# Patient Record
Sex: Male | Born: 1940 | Race: White | Hispanic: No | Smoking: Former smoker
Health system: Southern US, Community
[De-identification: ages and names within clinical notes are randomized; demographics above are authoritative.]

## PROBLEM LIST (undated history)

## (undated) DIAGNOSIS — E213 Hyperparathyroidism, unspecified: Secondary | ICD-10-CM

## (undated) DIAGNOSIS — E785 Hyperlipidemia, unspecified: Secondary | ICD-10-CM

## (undated) DIAGNOSIS — M48061 Spinal stenosis, lumbar region without neurogenic claudication: Secondary | ICD-10-CM

## (undated) DIAGNOSIS — L409 Psoriasis, unspecified: Secondary | ICD-10-CM

## (undated) DIAGNOSIS — I10 Essential (primary) hypertension: Secondary | ICD-10-CM

## (undated) DIAGNOSIS — E559 Vitamin D deficiency, unspecified: Secondary | ICD-10-CM

## (undated) DIAGNOSIS — N189 Chronic kidney disease, unspecified: Secondary | ICD-10-CM

## (undated) DIAGNOSIS — D649 Anemia, unspecified: Secondary | ICD-10-CM

## (undated) DIAGNOSIS — K219 Gastro-esophageal reflux disease without esophagitis: Secondary | ICD-10-CM

## (undated) HISTORY — DX: Gastro-esophageal reflux disease without esophagitis: K21.9

## (undated) HISTORY — DX: Vitamin D deficiency, unspecified: E55.9

## (undated) HISTORY — DX: Spinal stenosis, lumbar region without neurogenic claudication: M48.061

## (undated) HISTORY — DX: Essential (primary) hypertension: I10

## (undated) HISTORY — DX: Anemia, unspecified: D64.9

## (undated) HISTORY — DX: Chronic kidney disease, unspecified: N18.9

## (undated) HISTORY — DX: Hyperparathyroidism, unspecified: E21.3

## (undated) HISTORY — DX: Hyperlipidemia, unspecified: E78.5

## (undated) HISTORY — DX: Psoriasis, unspecified: L40.9

---

## 2003-03-29 ENCOUNTER — Ambulatory Visit (HOSPITAL_COMMUNITY): Admission: RE | Admit: 2003-03-29 | Discharge: 2003-03-29 | Payer: Self-pay | Admitting: Gastroenterology

## 2008-10-11 ENCOUNTER — Encounter: Admission: RE | Admit: 2008-10-11 | Discharge: 2008-10-11 | Payer: Self-pay | Admitting: Family Medicine

## 2010-10-19 IMAGING — CR DG CHEST 2V
2 series · 2 of 2 positions shown · non-contrast
Comparison: None

CLINICAL DATA: Abnormal physical exam.  Dullness to percussion of
the left upper lobe posteriorly.

CHEST - 2 VIEW

[view not recorded (1 of 2)]
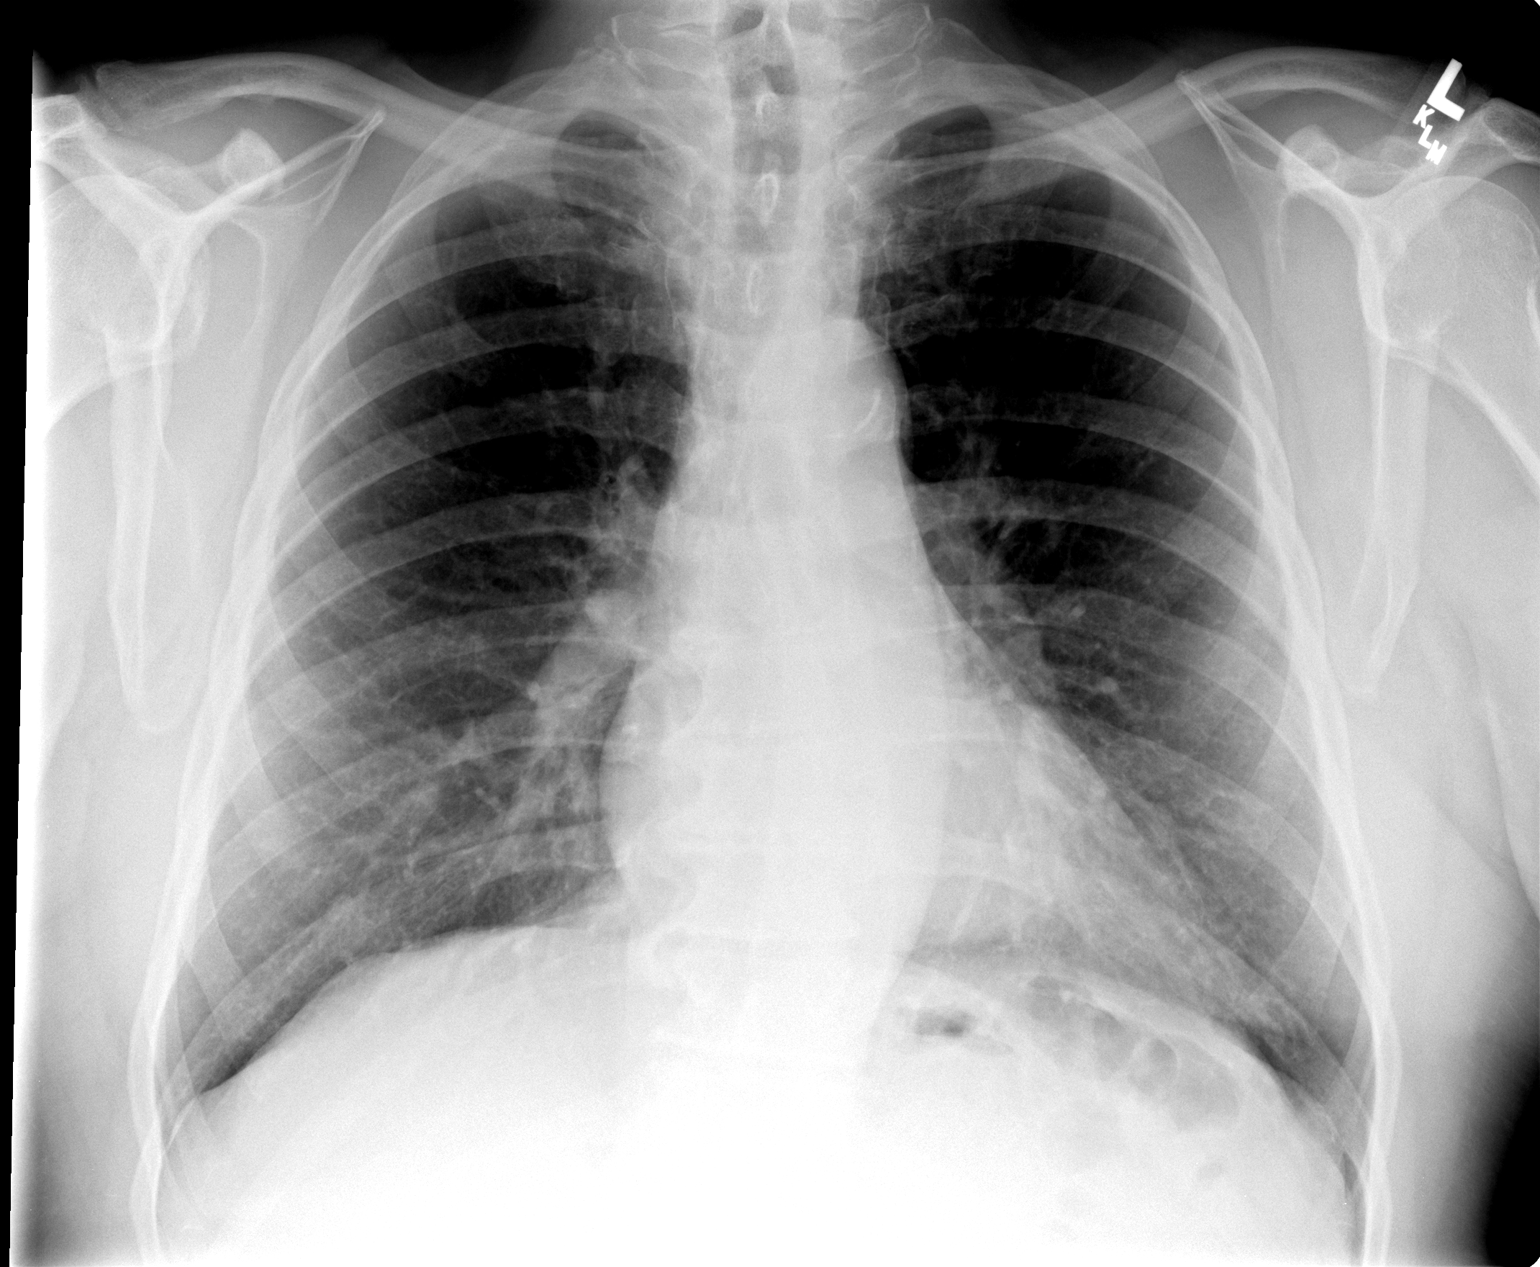

[view not recorded (2 of 2)]
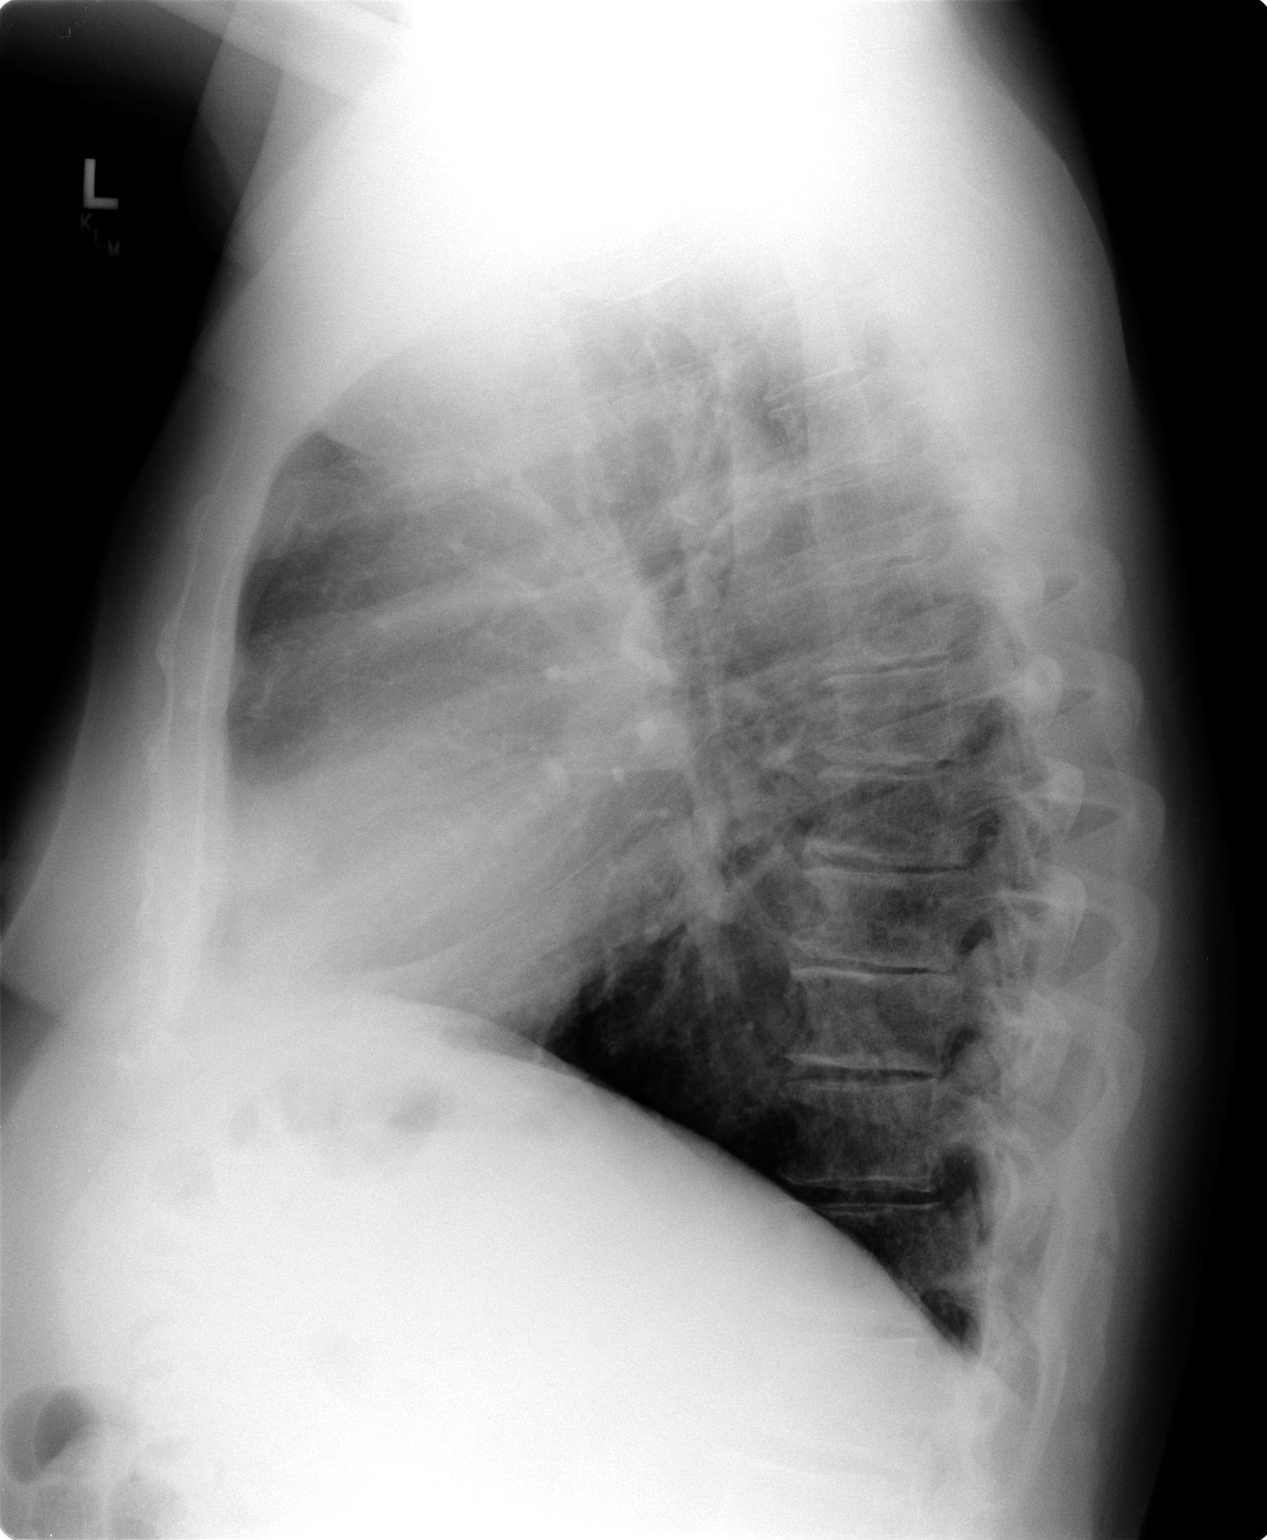

[2 of 2 positions shown; findings below may reference images not displayed]

FINDINGS: The heart size and pulmonary vascularity are normal.
There is slight tortuosity and calcification of the thoracic aorta.
The lungs are clear.  No significant bony abnormality.
Specifically, no evidence of abnormality in the left upper lobe.
IMPRESSION: No acute disease in the chest.

## 2010-11-30 NOTE — Op Note (Signed)
   NAME:  Isaac Beltran, Isaac Beltran                            ACCOUNT NO.:  [id number]   MEDICAL RECORD NO.:  [id number]                   PATIENT TYPE:  AMB   LOCATION:  ENDO                                 FACILITY:  [facility]   PHYSICIAN:  Griffith Citron, M.D.             DATE OF BIRTH:  09/02/1940   DATE OF PROCEDURE:  03/29/2003  DATE OF DISCHARGE:  03/29/2003                                 OPERATIVE REPORT   PROCEDURE PERFORMED:  Esophageal manometry.   DATE OF INTERPRETATION:  April 06, 2003.   INDICATIONS FOR PROCEDURE:  The patient is a 70 year old white male with  dysphagia.  Barium swallow with beaked appearance to the distal esophagus  suggestive of achalasia.  Also with spasm and tertiary esophageal  contraction.  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   DESCRIPTION OF PROCEDURE:  After reviewing the nature of the procedure with  the patient including but not limited to potential risks of hemorrhage and  perforation, informed consent was signed.  The patient underwent nasal  esophageal intubation using a low compliance catheter placed through the  left naris.  The study was successfully completed without immediate  complication.   RESULTS:  The lower esophageal sphincter was measured at 43.5 to 48.0 cm by  station pull through.  Total length of the lower esophageal sphincter was  4.5 cm.  Mean LESP 46.5 mmHg, just above normal (10.0-45.0 mmHg) with wet  swallows there was relaxation with 90% of these.  Close inspection of the  profile revealed very rapid transient relaxation with rapid return of  baseline tonicity appearing to be asynchronous with esophageal body  contractions.   The esophageal body revealed a mean amplitude of 75 mmHg with mean duration  of 3.7 seconds, both well within normal limits.  Peristalsis was seen on all  tracings.   The upper esophageal body functioned normal with good coordination with  esophageal body  propagation of contractions.   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   RECOMMENDATIONS:  Proceed with Botox therapy given the patient's persistent  symptoms despite symptoms esophageal dilation.                                               Griffith Citron, M.D.   JRM/MEDQ  D:  04/06/2003  T:  04/06/2003  Job:  [id number]   cc:   Aida Puffer  [address]  [place]  [place] [address]  Fax: [phone]

## 2014-02-01 ENCOUNTER — Other Ambulatory Visit: Payer: Self-pay | Admitting: Family Medicine

## 2014-02-01 ENCOUNTER — Ambulatory Visit
Admission: RE | Admit: 2014-02-01 | Discharge: 2014-02-01 | Disposition: A | Discharge status: Home / Self Care | Payer: Medicare Other | Source: Ambulatory Visit | Attending: Family Medicine | Admitting: Family Medicine

## 2014-02-01 DIAGNOSIS — Z0000 Encounter for general adult medical examination without abnormal findings: Secondary | ICD-10-CM

## 2018-11-30 ENCOUNTER — Other Ambulatory Visit: Payer: Self-pay | Admitting: Nephrology

## 2018-11-30 DIAGNOSIS — N183 Chronic kidney disease, stage 3 unspecified: Secondary | ICD-10-CM

## 2018-12-10 ENCOUNTER — Ambulatory Visit
Admission: RE | Admit: 2018-12-10 | Discharge: 2018-12-10 | Disposition: A | Discharge status: Home / Self Care | Payer: Medicare Other | Source: Ambulatory Visit | Attending: Nephrology | Admitting: Nephrology

## 2018-12-10 DIAGNOSIS — N183 Chronic kidney disease, stage 3 unspecified: Secondary | ICD-10-CM

## 2020-04-13 ENCOUNTER — Encounter: Payer: Self-pay | Admitting: Family Medicine

## 2020-04-13 ENCOUNTER — Ambulatory Visit (INDEPENDENT_AMBULATORY_CARE_PROVIDER_SITE_OTHER): Payer: Medicare Other | Admitting: Family Medicine

## 2020-04-13 ENCOUNTER — Other Ambulatory Visit: Payer: Self-pay

## 2020-04-13 VITALS — BP 144/82 | HR 68 | Temp 97.7°F | Wt 219.0 lb

## 2020-04-13 DIAGNOSIS — E559 Vitamin D deficiency, unspecified: Secondary | ICD-10-CM | POA: Insufficient documentation

## 2020-04-13 DIAGNOSIS — E785 Hyperlipidemia, unspecified: Secondary | ICD-10-CM | POA: Insufficient documentation

## 2020-04-13 DIAGNOSIS — E782 Mixed hyperlipidemia: Secondary | ICD-10-CM | POA: Diagnosis not present

## 2020-04-13 DIAGNOSIS — I10 Essential (primary) hypertension: Secondary | ICD-10-CM | POA: Insufficient documentation

## 2020-04-13 DIAGNOSIS — N189 Chronic kidney disease, unspecified: Secondary | ICD-10-CM | POA: Diagnosis not present

## 2020-04-13 DIAGNOSIS — K219 Gastro-esophageal reflux disease without esophagitis: Secondary | ICD-10-CM | POA: Insufficient documentation

## 2020-04-13 MED ORDER — VITAMIN D (ERGOCALCIFEROL) 1.25 MG (50000 UNIT) PO CAPS: 50000.0000 [IU] | ORAL_CAPSULE | ORAL | 1 refills | Status: DC

## 2020-04-13 NOTE — Progress Notes (Signed)
New Patient Office Visit  Assessment & Plan:  1. Essential hypertension - Well controlled on current regimen.  - chlorthalidone (HYGROTON) 25 MG tablet; Take 25 mg by mouth every morning. - CBC with Differential/Platelet - CMP14+EGFR - Lipid panel  2. Mixed hyperlipidemia - Labs today to assess for control.  - CMP14+EGFR - Lipid panel  3. Vitamin D deficiency - Labs today to assess for control.  - Vitamin D, Ergocalciferol, (DRISDOL) 1.25 MG (50000 UNIT) CAPS capsule; Take 1 capsule (50,000 Units total) by mouth once a week.  Dispense: 12 capsule; Refill: 1 - VITAMIN D 25 Hydroxy (Vit-D Deficiency, Fractures)  4. Chronic kidney disease, unspecified CKD stage - Managed by nephrology.  - CBC with Differential/Platelet - CMP14+EGFR  5. Gastroesophageal reflux disease, unspecified whether esophagitis present - Well controlled on current regimen.  - omeprazole (PRILOSEC) 20 MG capsule; Take 1 capsule by mouth daily. - CMP14+EGFR   Follow-up: Return in about 3 months (around 07/13/2020) for follow-up of chronic medication conditions.   Hendricks Limes, MSN, APRN, FNP-C [facility]  Subjective:  Patient ID: Isaac Beltran, male    DOB: 1941-03-16  Age: 79 y.o. MRN: [id number]  Patient Care Team: Loman Brooklyn, FNP as PCP - General (Family Medicine) Edrick Oh, MD as Consulting Physician (Nephrology)  CC:  Chief Complaint  Patient presents with  . New Patient (Initial Visit)    [facility]   . Establish Care    HPI Isaac Beltran presents to establish care. Patient is transferring care due to a move.  CKD managed by Dr. Justin Mend at [facility].   Patient takes a vitamin D supplement once weekly for vitamin D deficiency.   Taking chlorthalidone at night for hypertension.   Omeprazole controls GERD.   Review of Systems  Constitutional: Negative for chills, fever, malaise/fatigue and weight loss.  HENT: Negative for  congestion, ear discharge, ear pain, nosebleeds, sinus pain, sore throat and tinnitus.   Eyes: Negative for blurred vision, double vision, pain, discharge and redness.  Respiratory: Negative for cough, shortness of breath and wheezing.   Cardiovascular: Negative for chest pain, palpitations and leg swelling.  Gastrointestinal: Negative for abdominal pain, constipation, diarrhea, heartburn, nausea and vomiting.  Genitourinary: Negative for dysuria, frequency and urgency.  Musculoskeletal: Negative for myalgias.  Skin: Negative for rash.  Neurological: Negative for dizziness, seizures, weakness and headaches.  Psychiatric/Behavioral: Negative for depression, substance abuse and suicidal ideas. The patient is not nervous/anxious.     Current Outpatient Medications:  .  acetaminophen (TYLENOL) 500 MG tablet, Take 500 mg by mouth every 6 (six) hours as needed., Disp: , Rfl:  .  aspirin EC 81 MG tablet, Take 81 mg by mouth daily. Swallow whole., Disp: , Rfl:  .  chlorthalidone (HYGROTON) 25 MG tablet, Take 25 mg by mouth every morning., Disp: , Rfl:  .  ibuprofen (ADVIL) 200 MG tablet, 400 mg every 6 (six) hours as needed for Pain., Disp: , Rfl:  .  omeprazole (PRILOSEC) 20 MG capsule, Take 1 capsule by mouth daily., Disp: , Rfl:  .  OVER THE COUNTER MEDICATION, CBD OIL, Disp: , Rfl:  .  Testosterone (ANDROGEL PUMP) 20.25 MG/ACT (1.62%) GEL, Apply 1 puff topically daily as needed., Disp: , Rfl:  .  Vitamin D, Ergocalciferol, (DRISDOL) 1.25 MG (50000 UNIT) CAPS capsule, Take 50,000 Units by mouth once a week., Disp: , Rfl:   Allergies  Allergen Reactions  . Simvastatin Other (See Comments)  antihyperlipidemics    Past Medical History:  Diagnosis Date  . Anemia   . Chronic renal failure   . CKD (chronic kidney disease)   . GERD (gastroesophageal reflux disease)   . Hyperlipidemia   . Hyperparathyroidism ([place])   . Hypertension     History reviewed. No pertinent surgical  history.  Family History  Problem Relation Age of Onset  . Alzheimer's disease Mother   . Heart attack Father     Social History   Socioeconomic History  . Marital status: Married    Spouse name: Not on file  . Number of children: Not on file  . Years of education: Not on file  . Highest education level: Not on file  Occupational History  . Not on file  Tobacco Use  . Smoking status: Former Smoker    Types: Cigarettes    Quit date: 04/13/1985    Years since quitting: 35.0  . Smokeless tobacco: Never Used  Vaping Use  . Vaping Use: Never used  Substance and Sexual Activity  . Alcohol use: Yes    Comment: occ  . Drug use: Never  . Sexual activity: Not on file  Other Topics Concern  . Not on file  Social History Narrative  . Not on file   Social Determinants of Health   Financial Resource Strain:   . Difficulty of Paying Living Expenses: Not on file  Food Insecurity:   . Worried About Charity fundraiser in the Last Year: Not on file  . Ran Out of Food in the Last Year: Not on file  Transportation Needs:   . Lack of Transportation (Medical): Not on file  . Lack of Transportation (Non-Medical): Not on file  Physical Activity:   . Days of Exercise per Week: Not on file  . Minutes of Exercise per Session: Not on file  Stress:   . Feeling of Stress : Not on file  Social Connections:   . Frequency of Communication with Friends and Family: Not on file  . Frequency of Social Gatherings with Friends and Family: Not on file  . Attends Religious Services: Not on file  . Active Member of Clubs or Organizations: Not on file  . Attends Archivist Meetings: Not on file  . Marital Status: Not on file  Intimate Partner Violence:   . Fear of Current or Ex-Partner: Not on file  . Emotionally Abused: Not on file  . Physically Abused: Not on file  . Sexually Abused: Not on file    Objective:   Today's Vitals: BP (!) 144/82 Comment: manual  Pulse 68   Temp 97.7  F (36.5 C) (Temporal)   Wt 219 lb (99.3 kg)   SpO2 95%   Physical Exam Vitals reviewed.  Constitutional:      General: He is not in acute distress.    Appearance: Normal appearance. He is not ill-appearing, toxic-appearing or diaphoretic.  HENT:     Head: Normocephalic and atraumatic.  Eyes:     General: No scleral icterus.       Right eye: No discharge.        Left eye: No discharge.     Conjunctiva/sclera: Conjunctivae normal.  Cardiovascular:     Rate and Rhythm: Normal rate and regular rhythm.     Heart sounds: Normal heart sounds. No murmur heard.  No friction rub. No gallop.   Pulmonary:     Effort: Pulmonary effort is normal. No respiratory distress.  Breath sounds: Normal breath sounds. No stridor. No wheezing, rhonchi or rales.  Musculoskeletal:        General: Normal range of motion.     Cervical back: Normal range of motion.  Skin:    General: Skin is warm and dry.  Neurological:     Mental Status: He is alert and oriented to person, place, and time. Mental status is at baseline.  Psychiatric:        Mood and Affect: Mood normal.        Behavior: Behavior normal.        Thought Content: Thought content normal.        Judgment: Judgment normal.

## 2020-04-14 LAB — LIPID PANEL
Chol/HDL Ratio: 4.9 ratio (ref 0.0–5.0)
Cholesterol, Total: 177 mg/dL (ref 100–199)
HDL: 36 mg/dL — ABNORMAL LOW (ref >39)
LDL Chol Calc (NIH): 103 mg/dL — ABNORMAL HIGH (ref 0–99)
Triglycerides: 223 mg/dL — ABNORMAL HIGH (ref 0–149)
VLDL Cholesterol Cal: 38 mg/dL (ref 5–40)

## 2020-04-14 LAB — CBC WITH DIFFERENTIAL/PLATELET
Basophils Absolute: 0.1 10*3/uL (ref 0.0–0.2)
Basos: 2 %
EOS (ABSOLUTE): 0.5 10*3/uL — ABNORMAL HIGH (ref 0.0–0.4)
Eos: 7 %
Hematocrit: 38.9 % (ref 37.5–51.0)
Hemoglobin: 13.1 g/dL (ref 13.0–17.7)
Immature Grans (Abs): 0 10*3/uL (ref 0.0–0.1)
Immature Granulocytes: 0 %
Lymphocytes Absolute: 2.1 10*3/uL (ref 0.7–3.1)
Lymphs: 33 %
MCH: 29.8 pg (ref 26.6–33.0)
MCHC: 33.7 g/dL (ref 31.5–35.7)
MCV: 89 fL (ref 79–97)
Monocytes Absolute: 0.5 10*3/uL (ref 0.1–0.9)
Monocytes: 8 %
Neutrophils Absolute: 3.2 10*3/uL (ref 1.4–7.0)
Neutrophils: 50 %
Platelets: 270 10*3/uL (ref 150–450)
RBC: 4.39 x10E6/uL (ref 4.14–5.80)
RDW: 13 % (ref 11.6–15.4)
WBC: 6.4 10*3/uL (ref 3.4–10.8)

## 2020-04-14 LAB — CMP14+EGFR
ALT: 19 IU/L (ref 0–44)
AST: 22 IU/L (ref 0–40)
Albumin/Globulin Ratio: 1.9 (ref 1.2–2.2)
Albumin: 4.6 g/dL (ref 3.6–4.6)
Alkaline Phosphatase: 63 IU/L (ref 44–121)
BUN/Creatinine Ratio: 16 (ref 10–24)
BUN: 26 mg/dL (ref 8–27)
Bilirubin Total: 0.7 mg/dL (ref 0.0–1.2)
CO2: 26 mmol/L (ref 20–29)
Calcium: 10 mg/dL (ref 8.6–10.2)
Chloride: 105 mmol/L (ref 96–106)
Creatinine, Ser: 1.58 mg/dL — ABNORMAL HIGH (ref 0.76–1.27)
GFR calc Af Amer: 47 mL/min/{1.73_m2} — ABNORMAL LOW (ref >59)
GFR calc non Af Amer: 40 mL/min/{1.73_m2} — ABNORMAL LOW (ref >59)
Globulin, Total: 2.4 g/dL (ref 1.5–4.5)
Glucose: 100 mg/dL — ABNORMAL HIGH (ref 65–99)
Potassium: 4.7 mmol/L (ref 3.5–5.2)
Sodium: 143 mmol/L (ref 134–144)
Total Protein: 7 g/dL (ref 6.0–8.5)

## 2020-04-14 LAB — VITAMIN D 25 HYDROXY (VIT D DEFICIENCY, FRACTURES): Vit D, 25-Hydroxy: 59 ng/mL (ref 30.0–100.0)

## 2020-04-20 IMAGING — US US RENAL
1 series · 14 of 25 positions shown · non-contrast
Comparison: MRI 05/17/2015.

CLINICAL DATA: Chronic renal disease.

EXAM:
RENAL / URINARY TRACT ULTRASOUND COMPLETE

[Series 1: us renal · 0.25mm/px · 14 of 47 slices shown]
[im 1/47]
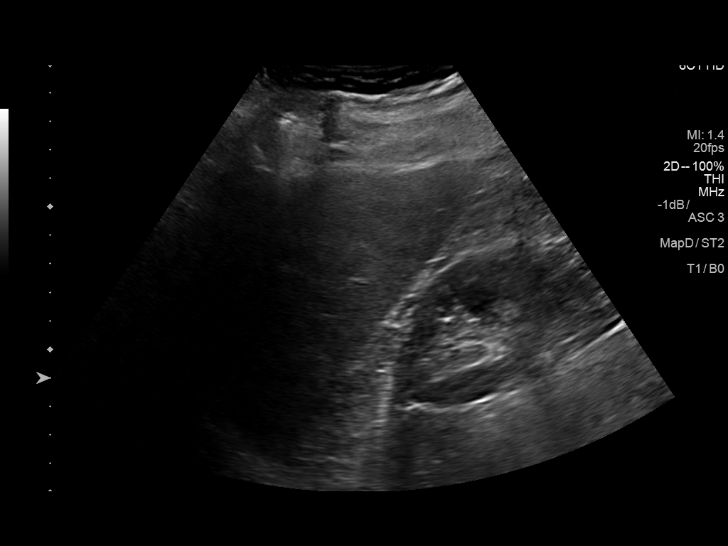
[im 4/47]
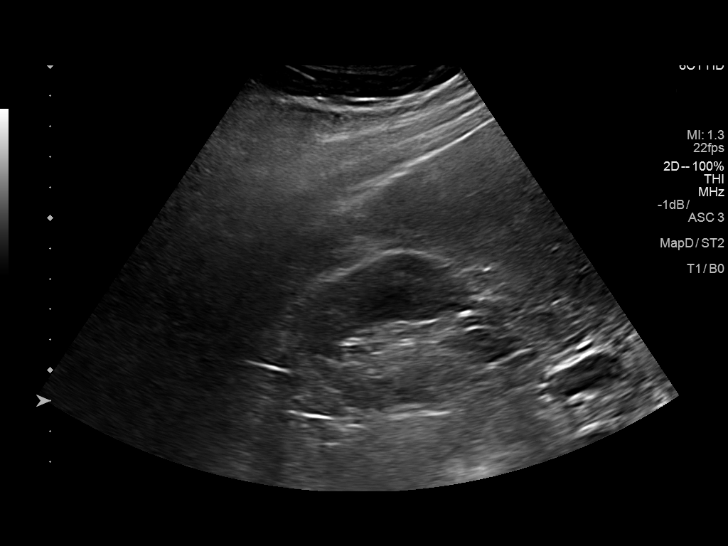
[im 8/47]
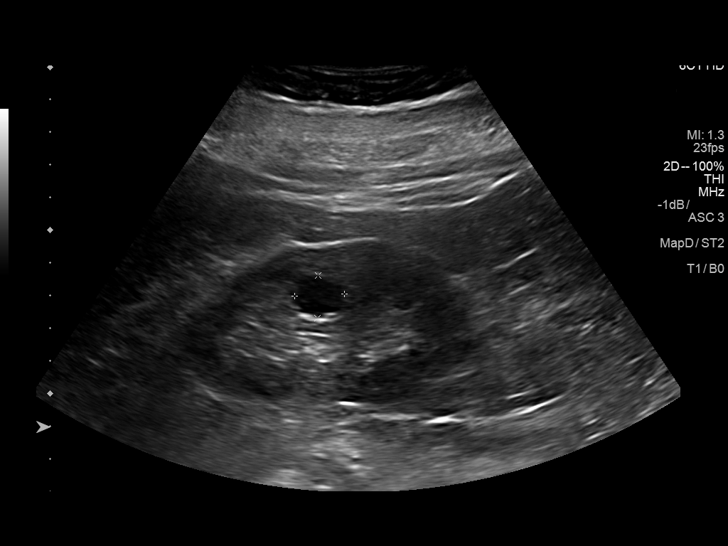
[im 12/47]
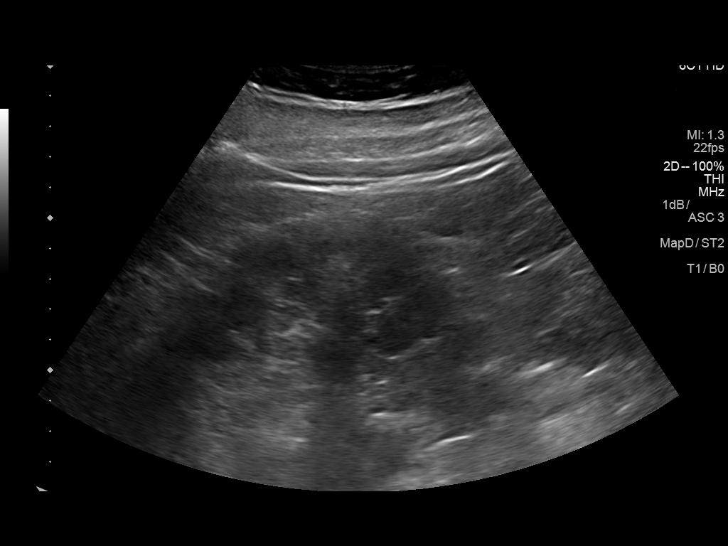
[im 16/47]
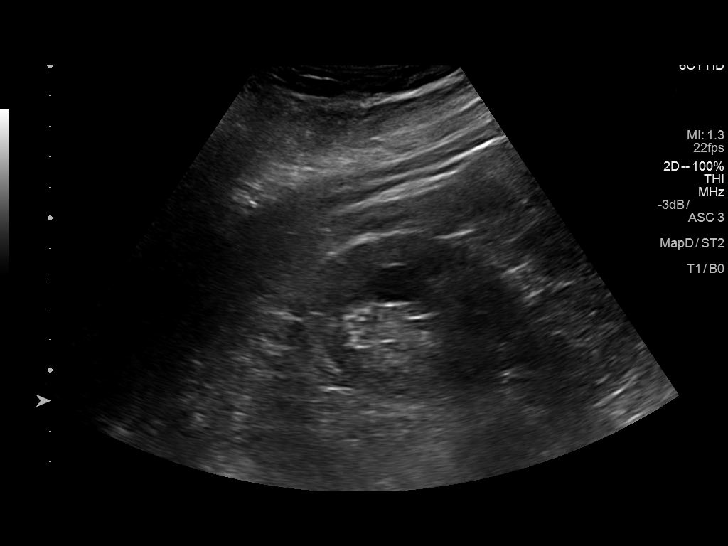
[im 18/47]
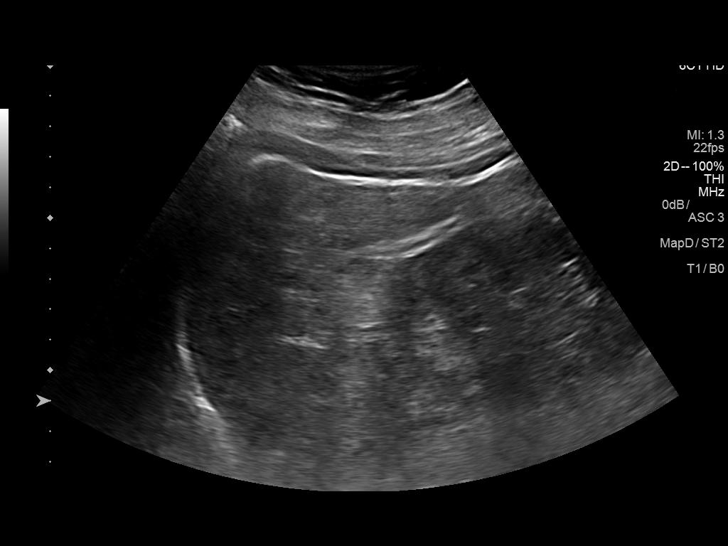
[im 22/47]
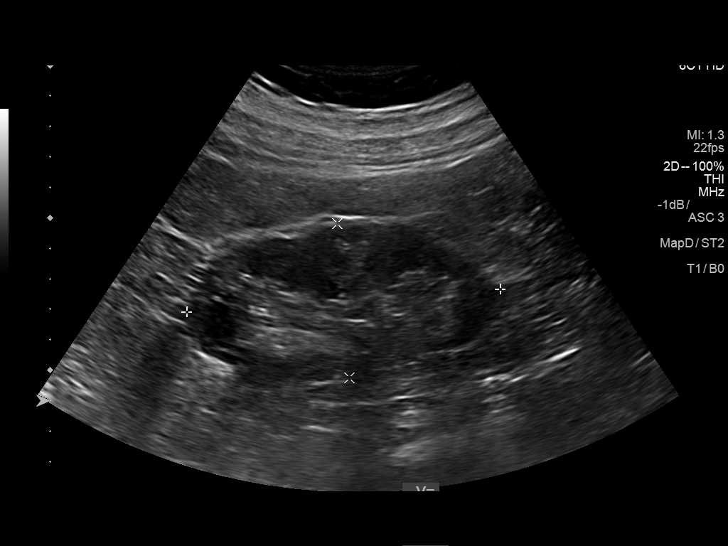
[im 25/47]
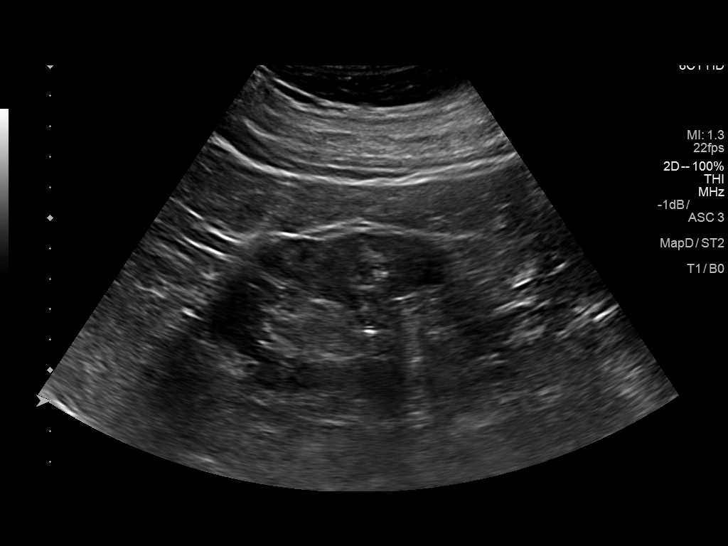
[im 29/47]
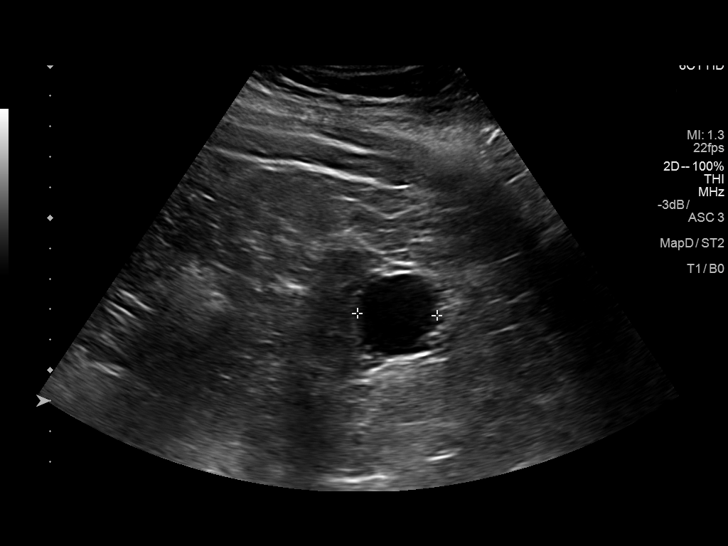
[im 31/47]
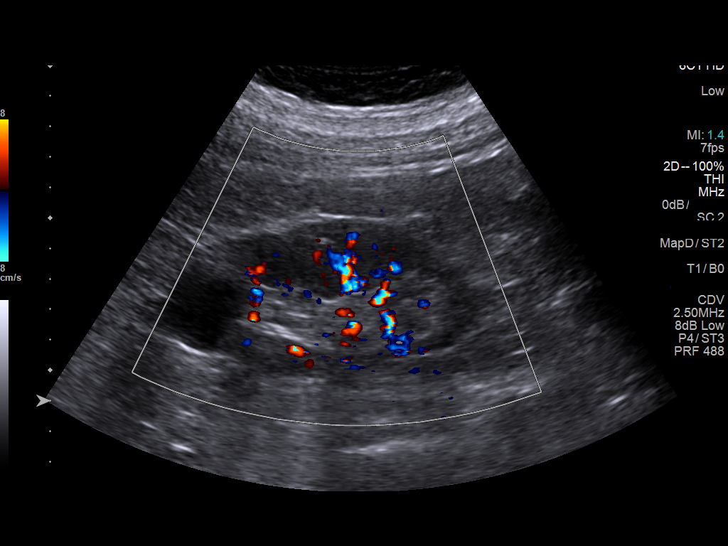
[im 35/47]
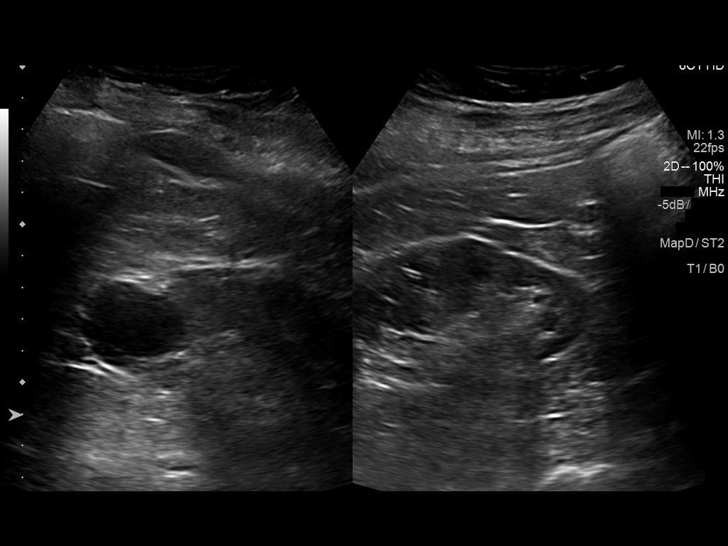
[im 39/47]
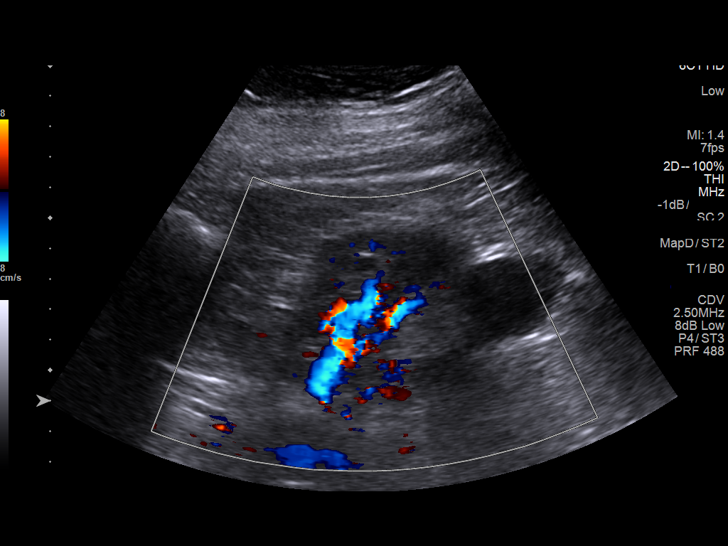
[im 43/47]
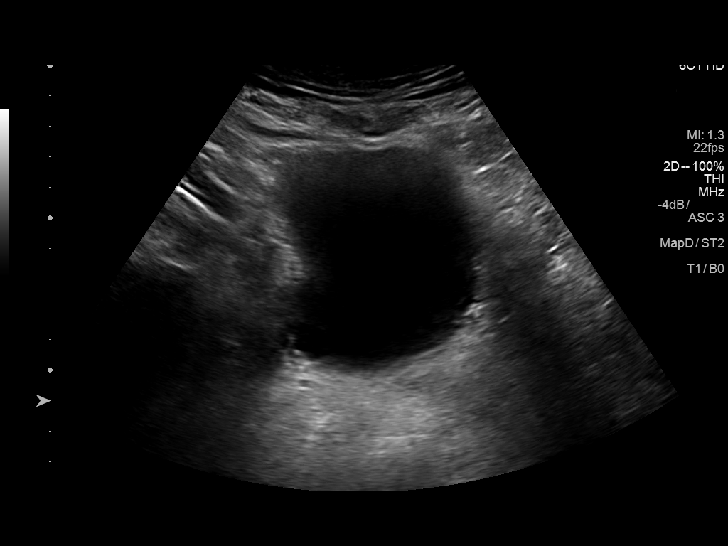
[im 47/47]
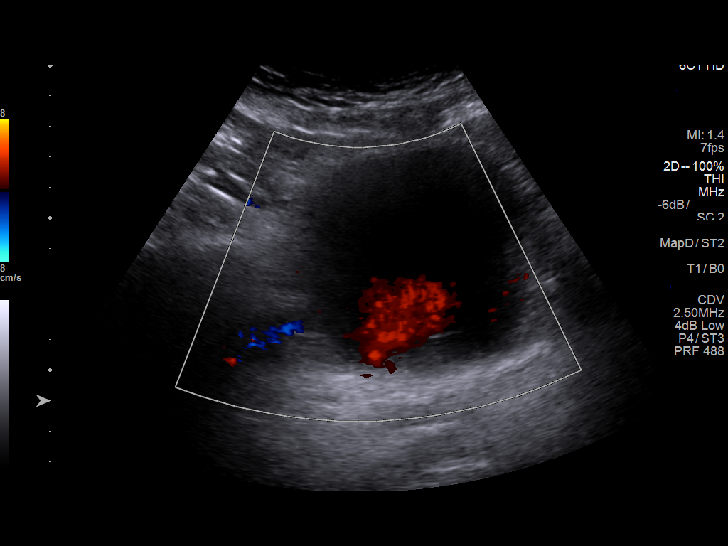

[14 of 25 positions shown; findings below may reference images not displayed]

FINDINGS: Right Kidney:

Renal measurements: 10.6 x 5.2 x 5.2 cm = volume: 150 mL .
Echogenicity within normal limits. 1.5 cm simple cyst. No
hydronephrosis visualized.

Left Kidney:

Renal measurements: 10.3 x 5.1 x 4.7 cm = volume: 12.9 scratch mL.
Echogenicity within normal limits. 2.9 cm simple cyst. No
hydronephrosis visualized.

Bladder:

Appears normal for degree of bladder distention.
IMPRESSION: Bilateral simple renal cysts. Exam otherwise unremarkable. No acute
abnormality. No hydronephrosis or bladder distention.

## 2020-04-23 ENCOUNTER — Encounter: Payer: Self-pay | Admitting: Family Medicine

## 2020-06-07 ENCOUNTER — Encounter: Payer: Self-pay | Admitting: *Deleted

## 2020-07-13 ENCOUNTER — Ambulatory Visit: Payer: Medicare Other | Admitting: Family Medicine

## 2020-07-27 ENCOUNTER — Ambulatory Visit (INDEPENDENT_AMBULATORY_CARE_PROVIDER_SITE_OTHER): Payer: Medicare Other | Admitting: Family Medicine

## 2020-07-27 DIAGNOSIS — E782 Mixed hyperlipidemia: Secondary | ICD-10-CM | POA: Diagnosis not present

## 2020-07-27 DIAGNOSIS — K219 Gastro-esophageal reflux disease without esophagitis: Secondary | ICD-10-CM

## 2020-07-27 DIAGNOSIS — N1832 Chronic kidney disease, stage 3b: Secondary | ICD-10-CM | POA: Diagnosis not present

## 2020-07-27 DIAGNOSIS — I10 Essential (primary) hypertension: Secondary | ICD-10-CM

## 2020-07-27 NOTE — Progress Notes (Signed)
Virtual Visit via Telephone Note  I connected with Isaac Beltran on 07/27/20 at 4:05 PM by telephone and verified that I am speaking with the correct person using two identifiers. Isaac Beltran is currently located in his vehicle and nobody is currently with him during this visit. The provider, Gwenlyn Fudge, FNP is located in their home at time of visit.  I discussed the limitations, risks, security and privacy concerns of performing an evaluation and management service by telephone and the availability of in person appointments. I also discussed with the patient that there may be a patient responsible charge related to this service. The patient expressed understanding and agreed to proceed.  Subjective: PCP: Gwenlyn Fudge, FNP  Chief Complaint  Patient presents with  . Medical Management of Chronic Issues   Patient does not check his blood pressure at home, but denies any dizziness, chest pain, or headaches.  He is still following with his nephrologist for CKD and reports he saw him recently and had lab work completed.   ROS: Per HPI  Current Outpatient Medications:  .  acetaminophen (TYLENOL) 500 MG tablet, Take 500 mg by mouth every 6 (six) hours as needed., Disp: , Rfl:  .  aspirin EC 81 MG tablet, Take 81 mg by mouth daily. Swallow whole., Disp: , Rfl:  .  chlorthalidone (HYGROTON) 25 MG tablet, Take 25 mg by mouth every morning., Disp: , Rfl:  .  ibuprofen (ADVIL) 200 MG tablet, 400 mg every 6 (six) hours as needed for Pain., Disp: , Rfl:  .  omeprazole (PRILOSEC) 20 MG capsule, Take 1 capsule by mouth daily., Disp: , Rfl:  .  OVER THE COUNTER MEDICATION, CBD OIL, Disp: , Rfl:  .  Testosterone (ANDROGEL PUMP) 20.25 MG/ACT (1.62%) GEL, Apply 1 puff topically daily as needed., Disp: , Rfl:  .  Vitamin D, Ergocalciferol, (DRISDOL) 1.25 MG (50000 UNIT) CAPS capsule, Take 1 capsule (50,000 Units total) by mouth once a week., Disp: 12 capsule, Rfl: 1  Allergies  Allergen  Reactions  . Simvastatin Other (See Comments)    antihyperlipidemics   Past Medical History:  Diagnosis Date  . Anemia   . CKD (chronic kidney disease)   . GERD (gastroesophageal reflux disease)   . Hyperlipidemia   . Hyperparathyroidism (HCC)   . Hypertension   . Lumbar spinal stenosis   . Psoriasis   . Vitamin D deficiency     Observations/Objective: A&O  No respiratory distress or wheezing audible over the phone Mood, judgement, and thought processes all WNL   Assessment and Plan: 1. Primary hypertension - Well controlled on current regimen.   2. Stage 3b chronic kidney disease (HCC) - Managed by nephrology.  3. Mixed hyperlipidemia - Well controlled on current regimen.   4. Gastroesophageal reflux disease, unspecified whether esophagitis present - Well controlled on current regimen.    Follow Up Instructions: Return in about 6 months (around 01/24/2021) for follow-up of chronic medication conditions.  I discussed the assessment and treatment plan with the patient. The patient was provided an opportunity to ask questions and all were answered. The patient agreed with the plan and demonstrated an understanding of the instructions.   The patient was advised to call back or seek an in-person evaluation if the symptoms worsen or if the condition fails to improve as anticipated.  The above assessment and management plan was discussed with the patient. The patient verbalized understanding of and has agreed to the management plan. Patient  is aware to call the clinic if symptoms persist or worsen. Patient is aware when to return to the clinic for a follow-up visit. Patient educated on when it is appropriate to go to the emergency department.   Time call ended: 4:16 PM  I provided 13 minutes of non-face-to-face time during this encounter.  Deliah Boston, MSN, APRN, FNP-C [facility] 07/27/20

## 2020-07-29 ENCOUNTER — Encounter: Payer: Self-pay | Admitting: Family Medicine

## 2020-10-10 ENCOUNTER — Other Ambulatory Visit: Payer: Self-pay | Admitting: Family Medicine

## 2020-10-10 DIAGNOSIS — E559 Vitamin D deficiency, unspecified: Secondary | ICD-10-CM

## 2020-11-02 ENCOUNTER — Other Ambulatory Visit: Payer: Self-pay | Admitting: Family Medicine

## 2020-11-02 DIAGNOSIS — E559 Vitamin D deficiency, unspecified: Secondary | ICD-10-CM

## 2020-11-09 ENCOUNTER — Other Ambulatory Visit: Payer: Self-pay | Admitting: Family Medicine

## 2020-11-09 DIAGNOSIS — E559 Vitamin D deficiency, unspecified: Secondary | ICD-10-CM

## 2020-11-15 ENCOUNTER — Other Ambulatory Visit: Payer: Self-pay

## 2020-11-15 DIAGNOSIS — E559 Vitamin D deficiency, unspecified: Secondary | ICD-10-CM

## 2020-11-15 MED ORDER — VITAMIN D (ERGOCALCIFEROL) 1.25 MG (50000 UNIT) PO CAPS: 50000.0000 [IU] | ORAL_CAPSULE | ORAL | 1 refills | Status: DC

## 2020-11-15 NOTE — Telephone Encounter (Signed)
Pt aware refill sent to pharmacy 

## 2020-11-15 NOTE — Telephone Encounter (Signed)
Pt is request refill on his Vit D, please advise last level was 59.0

## 2020-11-15 NOTE — Telephone Encounter (Signed)
  Prescription Request  11/15/2020  What is the name of the medication or equipment? Vitamin E  Have you contacted your pharmacy to request a refill? (if applicable) Yes  Which pharmacy would you like this sent to? [facility]  Pt called requesting that Britney send him in refills for Vitamin E, to [facility] in [place].

## 2021-01-26 ENCOUNTER — Encounter: Payer: Self-pay | Admitting: Family Medicine

## 2021-01-26 ENCOUNTER — Other Ambulatory Visit: Payer: Self-pay

## 2021-01-26 ENCOUNTER — Ambulatory Visit (INDEPENDENT_AMBULATORY_CARE_PROVIDER_SITE_OTHER): Payer: Medicare Other | Admitting: Family Medicine

## 2021-01-26 VITALS — BP 144/81 | HR 84 | Temp 98.4°F | Ht 69.0 in | Wt 208.6 lb

## 2021-01-26 DIAGNOSIS — G479 Sleep disorder, unspecified: Secondary | ICD-10-CM

## 2021-01-26 DIAGNOSIS — I10 Essential (primary) hypertension: Secondary | ICD-10-CM | POA: Diagnosis not present

## 2021-01-26 DIAGNOSIS — K219 Gastro-esophageal reflux disease without esophagitis: Secondary | ICD-10-CM

## 2021-01-26 DIAGNOSIS — N1832 Chronic kidney disease, stage 3b: Secondary | ICD-10-CM | POA: Diagnosis not present

## 2021-01-26 DIAGNOSIS — N23 Unspecified renal colic: Secondary | ICD-10-CM

## 2021-01-26 DIAGNOSIS — E559 Vitamin D deficiency, unspecified: Secondary | ICD-10-CM | POA: Diagnosis not present

## 2021-01-26 MED ORDER — TRAZODONE HCL 50 MG PO TABS: 50.0000 mg | ORAL_TABLET | Freq: Every evening | ORAL | 2 refills | Status: DC | PRN

## 2021-01-26 NOTE — Patient Instructions (Addendum)
Avoid Ibuprofen, Advil, Aleve, Motrin, Goody Powders, Naproxen, BC powders, Meloxicam, Diclofenac, Indomethacin and other nonsteroidal anti-inflammatory medications (NSAIDs).  

## 2021-01-26 NOTE — Progress Notes (Signed)
Assessment & Plan:  1. Essential hypertension Well controlled on current regimen.   2. Stage 3b chronic kidney disease (HCC) Managed by nephrology. Advised against ALL NSAIDs.   3. Difficulty sleeping Uncontrolled. Advised not to take any PM products.  - traZODone (DESYREL) 50 MG tablet; Take 1-2 tablets (50-100 mg total) by mouth at bedtime as needed for sleep.  Dispense: 30 tablet; Refill: 2  4. Vitamin D deficiency Well controlled on current regimen.   5. Gastroesophageal reflux disease, unspecified whether esophagitis present Well controlled on current regimen.   6. Ureter pain Discussed alcohol consumption can cause dehydration which can be causing the pain he is experiencing if the ureter is spasming.   Return in about 4 weeks (around 02/23/2021) for sleep (telephone), then 6 months for follow-up of chronic medication conditions.  Isaac Boston, MSN, APRN, FNP-C [facility]  Subjective:    Patient ID: Isaac Beltran, male    DOB: 03-27-1941, 80 y.o.   MRN: [id number]  Patient Care Team: Isaac Fudge, FNP as PCP - General (Family Medicine) Isaac Coil, MD as Consulting Physician (Nephrology)   Chief Complaint:  Chief Complaint  Patient presents with   Hyperlipidemia   Hypertension    6 month follow up     HPI: Isaac Beltran is a 80 y.o. male presenting on 01/26/2021 for Hyperlipidemia and Hypertension (6 month follow up )  Hypertension: does not monitor BP at home.   CKD managed by [facility] at [facility]. Ibuprofen is on patient's medication list. He states he has been taking Advil PM at night to help him sleep.   Patient takes a vitamin D supplement once weekly for vitamin D deficiency.  Omeprazole controls GERD.  New complaints: Patient reports pain in the left lower abdomen in the mornings after he drank alcohol the night before. Pain is constant when he is standing, but eases off when he sits down. Does not  occur any other time. No history of kidney stones and patient states it doesn't feel like that type of pain.   Social history:  Relevant past medical, surgical, family and social history reviewed and updated as indicated. Interim medical history since our last visit reviewed.  Allergies and medications reviewed and updated.  DATA REVIEWED: CHART IN EPIC  ROS: Negative unless specifically indicated above in HPI.    Current Outpatient Medications:    acetaminophen (TYLENOL) 500 MG tablet, Take 500 mg by mouth every 6 (six) hours as needed., Disp: , Rfl:    aspirin EC 81 MG tablet, Take 81 mg by mouth daily. Swallow whole., Disp: , Rfl:    chlorthalidone (HYGROTON) 25 MG tablet, Take 25 mg by mouth every morning., Disp: , Rfl:    ibuprofen (ADVIL) 200 MG tablet, 400 mg every 6 (six) hours as needed for Pain., Disp: , Rfl:    omeprazole (PRILOSEC) 20 MG capsule, Take 1 capsule by mouth daily., Disp: , Rfl:    OVER THE COUNTER MEDICATION, CBD OIL, Disp: , Rfl:    Testosterone 20.25 MG/ACT (1.62%) GEL, Apply 1 puff topically daily as needed., Disp: , Rfl:    Vitamin D, Ergocalciferol, (DRISDOL) 1.25 MG (50000 UNIT) CAPS capsule, Take 1 capsule (50,000 Units total) by mouth once a week., Disp: 12 capsule, Rfl: 1   Allergies  Allergen Reactions   Simvastatin Other (See Comments)    antihyperlipidemics   Past Medical History:  Diagnosis Date   Anemia    CKD (chronic kidney  disease)    GERD (gastroesophageal reflux disease)    Hyperlipidemia    Hyperparathyroidism (HCC)    Hypertension    Lumbar spinal stenosis    Psoriasis    Vitamin D deficiency     History reviewed. No pertinent surgical history.  Social History   Socioeconomic History   Marital status: Widowed    Spouse name: Not on file   Number of children: Not on file   Years of education: Not on file   Highest education level: Not on file  Occupational History   Not on file  Tobacco Use   Smoking status: Former     Types: Cigarettes    Quit date: 04/13/1985    Years since quitting: 35.8   Smokeless tobacco: Never  Vaping Use   Vaping Use: Never used  Substance and Sexual Activity   Alcohol use: Yes    Comment: occ   Drug use: Never   Sexual activity: Not on file  Other Topics Concern   Not on file  Social History Narrative   Not on file   Social Determinants of Health   Financial Resource Strain: Not on file  Food Insecurity: Not on file  Transportation Needs: Not on file  Physical Activity: Not on file  Stress: Not on file  Social Connections: Not on file  Intimate Partner Violence: Not on file        Objective:    BP (!) 144/81   Pulse 84   Temp 98.4 F (36.9 C) (Temporal)   Ht 5\' 9"  (1.753 m)   Wt 208 lb 9.6 oz (94.6 kg)   SpO2 95%   BMI 30.80 kg/m   Wt Readings from Last 3 Encounters:  01/26/21 208 lb 9.6 oz (94.6 kg)  04/13/20 219 lb (99.3 kg)    Physical Exam Vitals reviewed.  Constitutional:      General: He is not in acute distress.    Appearance: Normal appearance. He is obese. He is not ill-appearing, toxic-appearing or diaphoretic.  HENT:     Head: Normocephalic and atraumatic.  Eyes:     General: No scleral icterus.       Right eye: No discharge.        Left eye: No discharge.     Conjunctiva/sclera: Conjunctivae normal.  Cardiovascular:     Rate and Rhythm: Normal rate and regular rhythm.     Heart sounds: Normal heart sounds. No murmur heard.   No friction rub. No gallop.  Pulmonary:     Effort: Pulmonary effort is normal. No respiratory distress.     Breath sounds: Normal breath sounds. No stridor. No wheezing, rhonchi or rales.  Abdominal:     General: Abdomen is flat.     Palpations: Abdomen is soft. There is no shifting dullness or mass.     Tenderness: There is no abdominal tenderness.     Hernia: There is no hernia in the left inguinal area.  Musculoskeletal:        General: Normal range of motion.     Cervical back: Normal range of  motion.  Skin:    General: Skin is warm and dry.  Neurological:     Mental Status: He is alert and oriented to person, place, and time. Mental status is at baseline.  Psychiatric:        Mood and Affect: Mood normal.        Behavior: Behavior normal.        Thought Content: Thought content normal.  Judgment: Judgment normal.    No results found for: TSH Lab Results  Component Value Date   WBC 6.4 04/13/2020   HGB 13.1 04/13/2020   HCT 38.9 04/13/2020   MCV 89 04/13/2020   PLT 270 04/13/2020   Lab Results  Component Value Date   NA 143 04/13/2020   K 4.7 04/13/2020   CO2 26 04/13/2020   GLUCOSE 100 (H) 04/13/2020   BUN 26 04/13/2020   CREATININE 1.58 (H) 04/13/2020   BILITOT 0.7 04/13/2020   ALKPHOS 63 04/13/2020   AST 22 04/13/2020   ALT 19 04/13/2020   PROT 7.0 04/13/2020   ALBUMIN 4.6 04/13/2020   CALCIUM 10.0 04/13/2020   Lab Results  Component Value Date   CHOL 177 04/13/2020   Lab Results  Component Value Date   HDL 36 (L) 04/13/2020   Lab Results  Component Value Date   LDLCALC 103 (H) 04/13/2020   Lab Results  Component Value Date   TRIG 223 (H) 04/13/2020   Lab Results  Component Value Date   CHOLHDL 4.9 04/13/2020   No results found for: HGBA1C

## 2021-01-28 ENCOUNTER — Encounter: Payer: Self-pay | Admitting: Family Medicine

## 2021-01-28 DIAGNOSIS — N1832 Chronic kidney disease, stage 3b: Secondary | ICD-10-CM | POA: Insufficient documentation

## 2021-01-29 ENCOUNTER — Ambulatory Visit: Payer: Medicare Other

## 2021-01-31 ENCOUNTER — Ambulatory Visit (INDEPENDENT_AMBULATORY_CARE_PROVIDER_SITE_OTHER): Payer: Medicare Other

## 2021-01-31 VITALS — Wt 209.0 lb

## 2021-01-31 DIAGNOSIS — Z0000 Encounter for general adult medical examination without abnormal findings: Secondary | ICD-10-CM | POA: Diagnosis not present

## 2021-01-31 NOTE — Patient Instructions (Signed)
Isaac Beltran , Thank you for taking time to come for your Medicare Wellness Visit. I appreciate your ongoing commitment to your health goals. Please review the following plan we discussed and let me know if I can assist you in the future.   Screening recommendations/referrals: Colonoscopy: No longer required Recommended yearly ophthalmology/optometry visit for glaucoma screening and checkup Recommended yearly dental visit for hygiene and checkup  Vaccinations: Influenza vaccine: Declined Pneumococcal vaccine: Declined Tdap vaccine: Declined Shingles vaccine: Declined   Covid-19: Declined  Advanced directives: Please bring a copy of your health care power of attorney and living will to the office to be added to your chart at your convenience.   Conditions/risks identified: Aim for 30 minutes of exercise or brisk walking each day, drink 6-8 glasses of water and eat lots of fruits and vegetables.   Next appointment: Follow up in one year for your annual wellness visit.   Preventive Care 82 Years and Older, Male  Preventive care refers to lifestyle choices and visits with your health care provider that can promote health and wellness. What does preventive care include? A yearly physical exam. This is also called an annual well check. Dental exams once or twice a year. Routine eye exams. Ask your health care provider how often you should have your eyes checked. Personal lifestyle choices, including: Daily care of your teeth and gums. Regular physical activity. Eating a healthy diet. Avoiding tobacco and drug use. Limiting alcohol use. Practicing safe sex. Taking low doses of aspirin every day. Taking vitamin and mineral supplements as recommended by your health care provider. What happens during an annual well check? The services and screenings done by your health care provider during your annual well check will depend on your age, overall health, lifestyle risk factors, and family  history of disease. Counseling  Your health care provider may ask you questions about your: Alcohol use. Tobacco use. Drug use. Emotional well-being. Home and relationship well-being. Sexual activity. Eating habits. History of falls. Memory and ability to understand (cognition). Work and work Astronomer. Screening  You may have the following tests or measurements: Height, weight, and BMI. Blood pressure. Lipid and cholesterol levels. These may be checked every 5 years, or more frequently if you are over 82 years old. Skin check. Lung cancer screening. You may have this screening every year starting at age 82 if you have a 30-pack-year history of smoking and currently smoke or have quit within the past 15 years. Fecal occult blood test (FOBT) of the stool. You may have this test every year starting at age 82. Flexible sigmoidoscopy or colonoscopy. You may have a sigmoidoscopy every 5 years or a colonoscopy every 10 years starting at age 82. Prostate cancer screening. Recommendations will vary depending on your family history and other risks. Hepatitis C blood test. Hepatitis B blood test. Sexually transmitted disease (STD) testing. Diabetes screening. This is done by checking your blood sugar (glucose) after you have not eaten for a while (fasting). You may have this done every 1-3 years. Abdominal aortic aneurysm (AAA) screening. You may need this if you are a current or former smoker. Osteoporosis. You may be screened starting at age 82 if you are at high risk. Talk with your health care provider about your test results, treatment options, and if necessary, the need for more tests. Vaccines  Your health care provider may recommend certain vaccines, such as: Influenza vaccine. This is recommended every year. Tetanus, diphtheria, and acellular pertussis (Tdap, Td) vaccine. You  may need a Td booster every 10 years. Zoster vaccine. You may need this after age 82. Pneumococcal  13-valent conjugate (PCV13) vaccine. One dose is recommended after age 82. Pneumococcal polysaccharide (PPSV23) vaccine. One dose is recommended after age 82. Talk to your health care provider about which screenings and vaccines you need and how often you need them. This information is not intended to replace advice given to you by your health care provider. Make sure you discuss any questions you have with your health care provider. Document Released: 07/28/2015 Document Revised: 03/20/2016 Document Reviewed: 05/02/2015 Elsevier Interactive Patient Education  2017 [place] Prevention in the Home Falls can cause injuries. They can happen to people of all ages. There are many things you can do to make your home safe and to help prevent falls. What can I do on the outside of my home? Regularly fix the edges of walkways and driveways and fix any cracks. Remove anything that might make you trip as you walk through a door, such as a raised step or threshold. Trim any bushes or trees on the path to your home. Use bright outdoor lighting. Clear any walking paths of anything that might make someone trip, such as rocks or tools. Regularly check to see if handrails are loose or broken. Make sure that both sides of any steps have handrails. Any raised decks and porches should have guardrails on the edges. Have any leaves, snow, or ice cleared regularly. Use sand or salt on walking paths during winter. Clean up any spills in your garage right away. This includes oil or grease spills. What can I do in the bathroom? Use night lights. Install grab bars by the toilet and in the tub and shower. Do not use towel bars as grab bars. Use non-skid mats or decals in the tub or shower. If you need to sit down in the shower, use a plastic, non-slip stool. Keep the floor dry. Clean up any water that spills on the floor as soon as it happens. Remove soap buildup in the tub or shower regularly. Attach  bath mats securely with double-sided non-slip rug tape. Do not have throw rugs and other things on the floor that can make you trip. What can I do in the bedroom? Use night lights. Make sure that you have a light by your bed that is easy to reach. Do not use any sheets or blankets that are too big for your bed. They should not hang down onto the floor. Have a firm chair that has side arms. You can use this for support while you get dressed. Do not have throw rugs and other things on the floor that can make you trip. What can I do in the kitchen? Clean up any spills right away. Avoid walking on wet floors. Keep items that you use a lot in easy-to-reach places. If you need to reach something above you, use a strong step stool that has a grab bar. Keep electrical cords out of the way. Do not use floor polish or wax that makes floors slippery. If you must use wax, use non-skid floor wax. Do not have throw rugs and other things on the floor that can make you trip. What can I do with my stairs? Do not leave any items on the stairs. Make sure that there are handrails on both sides of the stairs and use them. Fix handrails that are broken or loose. Make sure that handrails are as long as the  stairways. Check any carpeting to make sure that it is firmly attached to the stairs. Fix any carpet that is loose or worn. Avoid having throw rugs at the top or bottom of the stairs. If you do have throw rugs, attach them to the floor with carpet tape. Make sure that you have a light switch at the top of the stairs and the bottom of the stairs. If you do not have them, ask someone to add them for you. What else can I do to help prevent falls? Wear shoes that: Do not have high heels. Have rubber bottoms. Are comfortable and fit you well. Are closed at the toe. Do not wear sandals. If you use a stepladder: Make sure that it is fully opened. Do not climb a closed stepladder. Make sure that both sides of the  stepladder are locked into place. Ask someone to hold it for you, if possible. Clearly mark and make sure that you can see: Any grab bars or handrails. First and last steps. Where the edge of each step is. Use tools that help you move around (mobility aids) if they are needed. These include: Canes. Walkers. Scooters. Crutches. Turn on the lights when you go into a dark area. Replace any light bulbs as soon as they burn out. Set up your furniture so you have a clear path. Avoid moving your furniture around. If any of your floors are uneven, fix them. If there are any pets around you, be aware of where they are. Review your medicines with your doctor. Some medicines can make you feel dizzy. This can increase your chance of falling. Ask your doctor what other things that you can do to help prevent falls. This information is not intended to replace advice given to you by your health care provider. Make sure you discuss any questions you have with your health care provider. Document Released: 04/27/2009 Document Revised: 12/07/2015 Document Reviewed: 08/05/2014 Elsevier Interactive Patient Education  2017 [employer].

## 2021-01-31 NOTE — Progress Notes (Signed)
Subjective:   Isaac Beltran is a 80 y.o. male who presents for an Initial Medicare Annual Wellness Visit.  Virtual Visit via Telephone Note  I connected with  Isaac Beltran on 01/31/21 at 11:15 AM EDT by telephone and verified that I am speaking with the correct person using two identifiers.  Location: Patient: Home Provider: [facility] Persons participating in the virtual visit: patient/Nurse Health Advisor   I discussed the limitations, risks, security and privacy concerns of performing an evaluation and management service by telephone and the availability of in person appointments. The patient expressed understanding and agreed to proceed.  Interactive audio and video telecommunications were attempted between this nurse and patient, however failed, due to patient having technical difficulties OR patient did not have access to video capability.  We continued and completed visit with audio only.  Some vital signs may be absent or patient reported.   Isaac Alverio E Ashden Sonnenberg, LPN   Review of Systems     Cardiac Risk Factors include: advanced age (>21men, >48 women);dyslipidemia;hypertension;male gender;obesity (BMI >30kg/m2);sedentary lifestyle     Objective:    Today's Vitals   01/31/21 1123  Weight: 209 lb (94.8 kg)   Body mass index is 30.86 kg/m.  Advanced Directives 01/31/2021  Does Patient Have a Medical Advance Directive? Yes  Type of Estate agent of [name];Living will  Copy of Healthcare Power of Attorney in Chart? No - copy requested    Current Medications (verified) Outpatient Encounter Medications as of 01/31/2021  Medication Sig   acetaminophen (TYLENOL) 500 MG tablet Take 500 mg by mouth every 6 (six) hours as needed.   aspirin EC 81 MG tablet Take 81 mg by mouth daily. Swallow whole.   chlorthalidone (HYGROTON) 25 MG tablet Take 25 mg by mouth every morning.   omeprazole (PRILOSEC) 20 MG capsule Take 1 capsule by mouth daily.   OVER THE COUNTER  MEDICATION CBD OIL   Testosterone 20.25 MG/ACT (1.62%) GEL Apply 1 puff topically daily as needed.   traZODone (DESYREL) 50 MG tablet Take 1-2 tablets (50-100 mg total) by mouth at bedtime as needed for sleep.   Vitamin D, Ergocalciferol, (DRISDOL) 1.25 MG (50000 UNIT) CAPS capsule Take 1 capsule (50,000 Units total) by mouth once a week.   No facility-administered encounter medications on file as of 01/31/2021.    Allergies (verified) Simvastatin   History: Past Medical History:  Diagnosis Date   Anemia    CKD (chronic kidney disease)    GERD (gastroesophageal reflux disease)    Hyperlipidemia    Hyperparathyroidism (HCC)    Hypertension    Lumbar spinal stenosis    Psoriasis    Vitamin D deficiency    History reviewed. No pertinent surgical history. Family History  Problem Relation Age of Onset   Alzheimer's disease Mother    Heart attack Father    Social History   Socioeconomic History   Marital status: Widowed    Spouse name: Not on file   Number of children: Not on file   Years of education: Not on file   Highest education level: Not on file  Occupational History   Occupation: retired  Tobacco Use   Smoking status: Former    Types: Cigarettes    Quit date: 04/13/1985    Years since quitting: 35.8   Smokeless tobacco: Never  Vaping Use   Vaping Use: Never used  Substance and Sexual Activity   Alcohol use: Yes    Alcohol/week: 1.0 standard drink  Types: 1 Cans of beer per week    Comment: occ   Drug use: Never   Sexual activity: Not on file  Other Topics Concern   Not on file  Social History Narrative   He cares for an adopted child who is now 74 - 01/31/21   Social Determinants of Health   Beltran Resource Strain: Low Risk    Difficulty of Paying Living Expenses: Not hard at all  Food Insecurity: No Food Insecurity   Worried About Programme researcher, broadcasting/film/video in the Last Year: Never true   Ran Out of Food in the Last Year: Never true  Transportation  Needs: No Transportation Needs   Lack of Transportation (Medical): No   Lack of Transportation (Non-Medical): No  Physical Activity: Insufficiently Active   Days of Exercise per Week: 7 days   Minutes of Exercise per Session: 20 min  Stress: No Stress Concern Present   Feeling of Stress : Not at all  Social Connections: Moderately Integrated   Frequency of Communication with Friends and Family: More than three times a week   Frequency of Social Gatherings with Friends and Family: Twice a week   Attends Religious Services: 1 to 4 times per year   Active Member of Isaac Beltran or Organizations: Yes   Attends Banker Meetings: 1 to 4 times per year   Marital Status: Widowed    Tobacco Counseling Counseling given: Not Answered   Clinical Intake:  Pre-visit preparation completed: Yes  Pain : No/denies pain     BMI - recorded: 30.86 Nutritional Status: BMI > 30  Obese Nutritional Risks: None Diabetes: No  How often do you need to have someone help you when you read instructions, pamphlets, or other written materials from your doctor or pharmacy?: 1 - Never  Diabetic? No  Interpreter Needed?: No  Information entered by :: Isaac Bostwick, LPN   Activities of Daily Living In your present state of health, do you have any difficulty performing the following activities: 01/31/2021  Hearing? Y  Comment mild hearing difficulties  Vision? N  Difficulty concentrating or making decisions? N  Walking or climbing stairs? N  Dressing or bathing? N  Doing errands, shopping? N  Preparing Food and eating ? N  Using the Toilet? N  In the past six months, have you accidently leaked urine? N  Do you have problems with loss of bowel control? N  Managing your Medications? N  Managing your Finances? N  Housekeeping or managing your Housekeeping? N  Some recent data might be hidden    Patient Care Team: Isaac Fudge, FNP as PCP - General (Family Medicine) Isaac Coil, MD as  Consulting Physician (Nephrology)  Indicate any recent Medical Services you may have received from other than [facility] providers in the past year (date may be approximate).     Assessment:   This is a routine wellness examination for [name].  Hearing/Vision screen Hearing Screening - Comments:: C/o mild hearing difficulties - declines hearing aids  Vision Screening - Comments:: Denies vision difficulties - hasn't seen an eye doctor in years.  Dietary issues and exercise activities discussed: Current Exercise Habits: Home exercise routine, Type of exercise: walking, Time (Minutes): 20, Frequency (Times/Week): 7, Weekly Exercise (Minutes/Week): 140, Intensity: Mild, Exercise limited by: None identified   Goals Addressed             This Visit's Progress    DIET - INCREASE WATER INTAKE  Depression Screen PHQ 2/9 Scores 01/31/2021 01/26/2021 04/13/2020  PHQ - 2 Score 0 0 0  PHQ- 9 Score 1 1 -    Fall Risk Fall Risk  01/31/2021 01/26/2021 04/13/2020  Falls in the past year? 0 0 0  Number falls in past yr: 0 - -  Injury with Fall? 0 - -  Risk for fall due to : No Fall Risks - -  Follow up Falls prevention discussed - -    FALL RISK PREVENTION PERTAINING TO THE HOME:  Any stairs in or around the home? No  If so, are there any without handrails? No  Home free of loose throw rugs in walkways, pet beds, electrical cords, etc? Yes  Adequate lighting in your home to reduce risk of falls? Yes   ASSISTIVE DEVICES UTILIZED TO PREVENT FALLS:  Life alert? No  Use of a cane, walker or w/c? No  Grab bars in the bathroom? Yes  Shower chair or bench in shower? No  Elevated toilet seat or a handicapped toilet? No   TIMED UP AND GO:  Was the test performed? No . Telephonic visit  Cognitive Function:     6CIT Screen 01/31/2021  What Year? 0 points  What month? 0 points  What time? 0 points  Count back from 20 0 points  Months in reverse 2 points  Repeat phrase 2 points  Total  Score 4    Immunizations  There is no immunization history on file for this patient.  TDAP status: Due, Education has been provided regarding the importance of this vaccine. Advised may receive this vaccine at local pharmacy or Health Dept. Aware to provide a copy of the vaccination record if obtained from local pharmacy or Health Dept. Verbalized acceptance and understanding.  Flu Vaccine status: Declined, Education has been provided regarding the importance of this vaccine but patient still declined. Advised may receive this vaccine at local pharmacy or Health Dept. Aware to provide a copy of the vaccination record if obtained from local pharmacy or Health Dept. Verbalized acceptance and understanding.  Pneumococcal vaccine status: Declined,  Education has been provided regarding the importance of this vaccine but patient still declined. Advised may receive this vaccine at local pharmacy or Health Dept. Aware to provide a copy of the vaccination record if obtained from local pharmacy or Health Dept. Verbalized acceptance and understanding.   Covid-19 vaccine status: Declined, Education has been provided regarding the importance of this vaccine but patient still declined. Advised may receive this vaccine at local pharmacy or Health Dept.or vaccine clinic. Aware to provide a copy of the vaccination record if obtained from local pharmacy or Health Dept. Verbalized acceptance and understanding.  Qualifies for Shingles Vaccine? Yes   Zostavax completed No   Shingrix Completed?: No.    Education has been provided regarding the importance of this vaccine. Patient has been advised to call insurance company to determine out of pocket expense if they have not yet received this vaccine. Advised may also receive vaccine at local pharmacy or Health Dept. Verbalized acceptance and understanding.  Screening Tests Health Maintenance  Topic Date Due   Zoster Vaccines- Shingrix (1 of 2) 04/28/2021 (Originally  09/24/1990)   TETANUS/TDAP  01/26/2022 (Originally 09/24/1959)   PNA vac Low Risk Adult (1 of 2 - PCV13) 01/26/2022 (Originally 09/25/2003)   INFLUENZA VACCINE  02/12/2021   HPV VACCINES  Aged Out   COVID-19 Vaccine  Discontinued    Health Maintenance  There are no preventive care reminders  to display for this patient.  Colorectal cancer screening: No longer required.   Lung Cancer Screening: (Low Dose CT Chest recommended if Age 57-80 years, 30 pack-year currently smoking OR have quit w/in 15years.) does not qualify  Additional Screening:  Hepatitis C Screening: does not qualify  Vision Screening: Recommended annual ophthalmology exams for early detection of glaucoma and other disorders of the eye. Is the patient up to date with their annual eye exam?  No  Who is the provider or what is the name of the office in which the patient attends annual eye exams? none If pt is not established with a provider, would they like to be referred to a provider to establish care? No .   Dental Screening: Recommended annual dental exams for proper oral hygiene  Community Resource Referral / Chronic Care Management: CRR required this visit?  No   CCM required this visit?  No      Plan:     I have personally reviewed and noted the following in the patient's chart:   Medical and social history Use of alcohol, tobacco or illicit drugs  Current medications and supplements including opioid prescriptions. Patient is not currently taking opioid prescriptions. Functional ability and status Nutritional status Physical activity Advanced directives List of other physicians Hospitalizations, surgeries, and ER visits in previous 12 months Vitals Screenings to include cognitive, depression, and falls Referrals and appointments  In addition, I have reviewed and discussed with patient certain preventive protocols, quality metrics, and best practice recommendations. A written personalized care plan  for preventive services as well as general preventive health recommendations were provided to patient.     [place] Constable, LPN   [id number]   Nurse Notes: Declines all screening and vaccines

## 2021-02-04 ENCOUNTER — Other Ambulatory Visit: Payer: Self-pay | Admitting: Family Medicine

## 2021-02-04 DIAGNOSIS — G479 Sleep disorder, unspecified: Secondary | ICD-10-CM

## 2021-02-12 ENCOUNTER — Telehealth: Payer: Self-pay | Admitting: Family Medicine

## 2021-02-12 DIAGNOSIS — G479 Sleep disorder, unspecified: Secondary | ICD-10-CM

## 2021-02-12 NOTE — Telephone Encounter (Signed)
Can I get some more information on why he cannot take this?

## 2021-02-13 MED ORDER — BELSOMRA 10 MG PO TABS: 10.0000 mg | ORAL_TABLET | Freq: Every evening | ORAL | 0 refills | Status: DC | PRN

## 2021-02-13 NOTE — Telephone Encounter (Signed)
Stop Trazodone. Start Belsomra 10 mg at bedtime. He will need to schedule a follow-up before a refill is needed (I sent 30 days worth). I would be okay with this being a telephone visit.

## 2021-02-13 NOTE — Telephone Encounter (Signed)
Patient states that the trazodone does not help him sleep and makes him very weak.

## 2021-02-13 NOTE — Telephone Encounter (Signed)
Lmtcb.

## 2021-02-27 NOTE — Telephone Encounter (Signed)
According to chart, Belsomra was prescribed instead

## 2021-03-07 ENCOUNTER — Other Ambulatory Visit: Payer: Self-pay | Admitting: Family Medicine

## 2021-03-07 DIAGNOSIS — E559 Vitamin D deficiency, unspecified: Secondary | ICD-10-CM

## 2021-03-15 ENCOUNTER — Other Ambulatory Visit: Payer: Self-pay | Admitting: Family Medicine

## 2021-03-15 DIAGNOSIS — G479 Sleep disorder, unspecified: Secondary | ICD-10-CM

## 2021-03-15 NOTE — Telephone Encounter (Signed)
Lmtcb to schedule follow up visit per telephone encounter 02/12/21.   Please refuse rx as I can't due to controlled substance.

## 2021-03-20 ENCOUNTER — Ambulatory Visit: Payer: Medicare Other | Admitting: Family Medicine

## 2021-03-20 DIAGNOSIS — G479 Sleep disorder, unspecified: Secondary | ICD-10-CM

## 2021-03-20 DIAGNOSIS — R7989 Other specified abnormal findings of blood chemistry: Secondary | ICD-10-CM | POA: Diagnosis not present

## 2021-03-20 MED ORDER — ZOLPIDEM TARTRATE 5 MG PO TABS: 5.0000 mg | ORAL_TABLET | Freq: Every evening | ORAL | 0 refills | Status: DC | PRN

## 2021-03-20 NOTE — Progress Notes (Signed)
Virtual Visit via Telephone Note  I connected with Isaac Beltran on 03/20/21 at 8:31 AM by telephone and verified that I am speaking with the correct person using two identifiers. Isaac Beltran is currently located in his vehicle and nobody is currently with him during this visit. The provider, Gwenlyn Fudge, FNP is located in their office at time of visit.  I discussed the limitations, risks, security and privacy concerns of performing an evaluation and management service by telephone and the availability of in person appointments. I also discussed with the patient that there may be a patient responsible charge related to this service. The patient expressed understanding and agreed to proceed.  Subjective: PCP: Gwenlyn Fudge, FNP  Chief Complaint  Patient presents with   Insomnia   Patient is following on up difficulty sleeping. He did not do well with Trazodone or Belsomra. He states with the Belsomra he fell asleep quickly, but continued to wake up during the night.  Patient also states he use to be on testosterone and would like to resume.    ROS: Per HPI  Current Outpatient Medications:    acetaminophen (TYLENOL) 500 MG tablet, Take 500 mg by mouth every 6 (six) hours as needed., Disp: , Rfl:    aspirin EC 81 MG tablet, Take 81 mg by mouth daily. Swallow whole., Disp: , Rfl:    chlorthalidone (HYGROTON) 25 MG tablet, Take 25 mg by mouth every morning., Disp: , Rfl:    omeprazole (PRILOSEC) 20 MG capsule, Take 1 capsule by mouth daily., Disp: , Rfl:    OVER THE COUNTER MEDICATION, CBD OIL, Disp: , Rfl:    Suvorexant (BELSOMRA) 10 MG TABS, Take 10 mg by mouth at bedtime as needed., Disp: 30 tablet, Rfl: 0   Testosterone 20.25 MG/ACT (1.62%) GEL, Apply 1 puff topically daily as needed., Disp: , Rfl:    Vitamin D, Ergocalciferol, (DRISDOL) 1.25 MG (50000 UNIT) CAPS capsule, Take 1 capsule (50,000 Units total) by mouth once a week., Disp: 12 capsule, Rfl: 1  Allergies   Allergen Reactions   Simvastatin Other (See Comments)    antihyperlipidemics   Past Medical History:  Diagnosis Date   Anemia    CKD (chronic kidney disease)    GERD (gastroesophageal reflux disease)    Hyperlipidemia    Hyperparathyroidism (HCC)    Hypertension    Lumbar spinal stenosis    Psoriasis    Vitamin D deficiency     Observations/Objective: A&O  No respiratory distress or wheezing audible over the phone Mood, judgement, and thought processes all WNL   Assessment and Plan: 1. Difficulty sleeping Uncontrolled. Failed treatment with Trazodone and Belsomra. Started Ambien today. - zolpidem (AMBIEN) 5 MG tablet; Take 1 tablet (5 mg total) by mouth at bedtime as needed for sleep.  Dispense: 15 tablet; Refill: 0  2. Low testosterone Advised patient he needs to come for lab work prior to starting testosterone. This needs to be done before 10 AM. - Testosterone,Free and Total; Future   Follow Up Instructions: Return in about 2 weeks (around 04/03/2021) for Sleep (telephone).  I discussed the assessment and treatment plan with the patient. The patient was provided an opportunity to ask questions and all were answered. The patient agreed with the plan and demonstrated an understanding of the instructions.   The patient was advised to call back or seek an in-person evaluation if the symptoms worsen or if the condition fails to improve as anticipated.  The above  assessment and management plan was discussed with the patient. The patient verbalized understanding of and has agreed to the management plan. Patient is aware to call the clinic if symptoms persist or worsen. Patient is aware when to return to the clinic for a follow-up visit. Patient educated on when it is appropriate to go to the emergency department.   Time call ended: 8:42 AM  I provided 11 minutes of non-face-to-face time during this encounter.  Deliah Boston, MSN, APRN, FNP-C [facility] 03/20/21

## 2021-03-23 ENCOUNTER — Other Ambulatory Visit: Payer: Self-pay

## 2021-03-23 ENCOUNTER — Other Ambulatory Visit: Payer: Medicare Other

## 2021-03-23 DIAGNOSIS — R7989 Other specified abnormal findings of blood chemistry: Secondary | ICD-10-CM

## 2021-03-28 LAB — TESTOSTERONE,FREE AND TOTAL
Testosterone, Free: 15.6 pg/mL (ref 6.6–18.1)
Testosterone: 295 ng/dL (ref 264–916)

## 2021-04-03 ENCOUNTER — Encounter: Payer: Self-pay | Admitting: Family Medicine

## 2021-04-03 ENCOUNTER — Ambulatory Visit (INDEPENDENT_AMBULATORY_CARE_PROVIDER_SITE_OTHER): Payer: Medicare Other | Admitting: Family Medicine

## 2021-04-03 DIAGNOSIS — G479 Sleep disorder, unspecified: Secondary | ICD-10-CM | POA: Diagnosis not present

## 2021-04-03 DIAGNOSIS — N529 Male erectile dysfunction, unspecified: Secondary | ICD-10-CM

## 2021-04-03 MED ORDER — SILDENAFIL CITRATE 20 MG PO TABS: 20.0000 mg | ORAL_TABLET | Freq: Every day | ORAL | 2 refills | Status: DC | PRN

## 2021-04-03 MED ORDER — TEMAZEPAM 15 MG PO CAPS: 15.0000 mg | ORAL_CAPSULE | Freq: Every day | ORAL | 0 refills | Status: DC

## 2021-04-03 NOTE — Progress Notes (Signed)
Virtual Visit via Telephone Note  I connected with Isaac Beltran on 04/03/21 at 9:04 AM by telephone and verified that I am speaking with the correct person using two identifiers. Isaac Beltran is currently located at home and nobody is currently with him during this visit. The provider, Gwenlyn Fudge, FNP is located in their office at time of visit.  I discussed the limitations, risks, security and privacy concerns of performing an evaluation and management service by telephone and the availability of in person appointments. I also discussed with the patient that there may be a patient responsible charge related to this service. The patient expressed understanding and agreed to proceed.  Subjective: PCP: Gwenlyn Fudge, FNP  Chief Complaint  Patient presents with   Insomnia   Patient is following up on difficulty sleeping.  He has failed treatment with trazodone and Belsomra.  He was most recently started on Ambien which he states is "no good".  He was on temazepam last year with his previous PCP, but cannot remember if it was helpful or not.  Patient is requesting a prescription for Viagra.  States he used to have one.   ROS: Per HPI  Current Outpatient Medications:    acetaminophen (TYLENOL) 500 MG tablet, Take 500 mg by mouth every 6 (six) hours as needed., Disp: , Rfl:    aspirin EC 81 MG tablet, Take 81 mg by mouth daily. Swallow whole., Disp: , Rfl:    chlorthalidone (HYGROTON) 25 MG tablet, Take 25 mg by mouth every morning., Disp: , Rfl:    omeprazole (PRILOSEC) 20 MG capsule, Take 1 capsule by mouth daily., Disp: , Rfl:    OVER THE COUNTER MEDICATION, CBD OIL, Disp: , Rfl:    Testosterone 20.25 MG/ACT (1.62%) GEL, Apply 1 puff topically daily as needed., Disp: , Rfl:    Vitamin D, Ergocalciferol, (DRISDOL) 1.25 MG (50000 UNIT) CAPS capsule, Take 1 capsule (50,000 Units total) by mouth once a week., Disp: 12 capsule, Rfl: 1   zolpidem (AMBIEN) 5 MG tablet, Take 1 tablet  (5 mg total) by mouth at bedtime as needed for sleep., Disp: 15 tablet, Rfl: 0  Allergies  Allergen Reactions   Simvastatin Other (See Comments)    antihyperlipidemics   Past Medical History:  Diagnosis Date   Anemia    CKD (chronic kidney disease)    GERD (gastroesophageal reflux disease)    Hyperlipidemia    Hyperparathyroidism (HCC)    Hypertension    Lumbar spinal stenosis    Psoriasis    Vitamin D deficiency     Observations/Objective: A&O  No respiratory distress or wheezing audible over the phone Mood, judgement, and thought processes all WNL   Assessment and Plan: 1. Difficulty sleeping Uncontrolled.  Started temazepam. - temazepam (RESTORIL) 15 MG capsule; Take 1 capsule (15 mg total) by mouth at bedtime.  Dispense: 30 capsule; Refill: 0  2. Erectile dysfunction, unspecified erectile dysfunction type Started sildenafil. - sildenafil (REVATIO) 20 MG tablet; Take 1-5 tablets (20-100 mg total) by mouth daily as needed.  Dispense: 30 tablet; Refill: 2   Follow Up Instructions: Return in about 3 weeks (around 04/24/2021) for Sleep (telephone).  I discussed the assessment and treatment plan with the patient. The patient was provided an opportunity to ask questions and all were answered. The patient agreed with the plan and demonstrated an understanding of the instructions.   The patient was advised to call back or seek an in-person evaluation if the symptoms  worsen or if the condition fails to improve as anticipated.  The above assessment and management plan was discussed with the patient. The patient verbalized understanding of and has agreed to the management plan. Patient is aware to call the clinic if symptoms persist or worsen. Patient is aware when to return to the clinic for a follow-up visit. Patient educated on when it is appropriate to go to the emergency department.   Time call ended: 9:15 AM  I provided 11 minutes of non-face-to-face time during this  encounter.  Deliah Boston, MSN, APRN, FNP-C [facility] 04/03/21

## 2021-04-25 ENCOUNTER — Encounter: Payer: Self-pay | Admitting: Family Medicine

## 2021-04-25 ENCOUNTER — Ambulatory Visit: Payer: Medicare Other | Admitting: Family Medicine

## 2021-04-25 DIAGNOSIS — G479 Sleep disorder, unspecified: Secondary | ICD-10-CM | POA: Diagnosis not present

## 2021-04-25 MED ORDER — TEMAZEPAM 30 MG PO CAPS: 30.0000 mg | ORAL_CAPSULE | Freq: Every day | ORAL | 2 refills | Status: DC

## 2021-04-25 NOTE — Progress Notes (Signed)
Virtual Visit via Telephone Note  I connected with Isaac Beltran on 04/25/21 at 8:13 AM by telephone and verified that I am speaking with the correct person using two identifiers. Isaac Beltran is currently located at home and nobody is currently with him during this visit. The provider, Gwenlyn Fudge, FNP is located in their office at time of visit.  I discussed the limitations, risks, security and privacy concerns of performing an evaluation and management service by telephone and the availability of in person appointments. I also discussed with the patient that there may be a patient responsible charge related to this service. The patient expressed understanding and agreed to proceed.  Subjective: PCP: Gwenlyn Fudge, FNP  Chief Complaint  Patient presents with   Insomnia   Patient is following up on difficulty sleeping.  He has failed treatment with trazodone, Belsomra, and Ambien. He was most recently started on temazepam which he reports it is working for him, but he is taking two capsules each night.    ROS: Per HPI  Current Outpatient Medications:    acetaminophen (TYLENOL) 500 MG tablet, Take 500 mg by mouth every 6 (six) hours as needed., Disp: , Rfl:    aspirin EC 81 MG tablet, Take 81 mg by mouth daily. Swallow whole., Disp: , Rfl:    chlorthalidone (HYGROTON) 25 MG tablet, Take 25 mg by mouth every morning., Disp: , Rfl:    omeprazole (PRILOSEC) 20 MG capsule, Take 1 capsule by mouth daily., Disp: , Rfl:    OVER THE COUNTER MEDICATION, CBD OIL, Disp: , Rfl:    sildenafil (REVATIO) 20 MG tablet, Take 1-5 tablets (20-100 mg total) by mouth daily as needed., Disp: 30 tablet, Rfl: 2   temazepam (RESTORIL) 15 MG capsule, Take 1 capsule (15 mg total) by mouth at bedtime., Disp: 30 capsule, Rfl: 0   Vitamin D, Ergocalciferol, (DRISDOL) 1.25 MG (50000 UNIT) CAPS capsule, Take 1 capsule (50,000 Units total) by mouth once a week., Disp: 12 capsule, Rfl: 1  Allergies   Allergen Reactions   Simvastatin Other (See Comments)    antihyperlipidemics   Past Medical History:  Diagnosis Date   Anemia    CKD (chronic kidney disease)    GERD (gastroesophageal reflux disease)    Hyperlipidemia    Hyperparathyroidism (HCC)    Hypertension    Lumbar spinal stenosis    Psoriasis    Vitamin D deficiency     Observations/Objective: A&O  No respiratory distress or wheezing audible over the phone Mood, judgement, and thought processes all WNL   Assessment and Plan: 1. Difficulty sleeping Well controlled on current regimen of 30 mg at bedtime. New prescription sent for increased dosage.  - temazepam (RESTORIL) 30 MG capsule; Take 1 capsule (30 mg total) by mouth at bedtime.  Dispense: 30 capsule; Refill: 2   Follow Up Instructions: Return in about 3 months (around 07/26/2021) for annual physical.  I discussed the assessment and treatment plan with the patient. The patient was provided an opportunity to ask questions and all were answered. The patient agreed with the plan and demonstrated an understanding of the instructions.   The patient was advised to call back or seek an in-person evaluation if the symptoms worsen or if the condition fails to improve as anticipated.  The above assessment and management plan was discussed with the patient. The patient verbalized understanding of and has agreed to the management plan. Patient is aware to call the clinic if symptoms  persist or worsen. Patient is aware when to return to the clinic for a follow-up visit. Patient educated on when it is appropriate to go to the emergency department.   Time call ended: 8:24 AM  I provided 11 minutes of non-face-to-face time during this encounter.  Deliah Boston, MSN, APRN, FNP-C [facility] 04/25/21

## 2021-05-21 ENCOUNTER — Other Ambulatory Visit: Payer: Self-pay | Admitting: Family Medicine

## 2021-05-21 DIAGNOSIS — E559 Vitamin D deficiency, unspecified: Secondary | ICD-10-CM

## 2021-05-21 NOTE — Telephone Encounter (Signed)
Please let patient know he does not need to continue weekly vitamin D supplement with normal vitamin D levels. He can switch to OTC vitamin D3 1,000 units daily.

## 2021-05-22 NOTE — Telephone Encounter (Signed)
Patient aware and verbalizes understanding. 

## 2021-05-22 NOTE — Telephone Encounter (Signed)
Lmtcb.

## 2021-07-26 ENCOUNTER — Ambulatory Visit (INDEPENDENT_AMBULATORY_CARE_PROVIDER_SITE_OTHER): Payer: Medicare Other | Admitting: Family Medicine

## 2021-07-26 VITALS — BP 135/70 | HR 71 | Temp 97.9°F | Ht 69.0 in | Wt 210.2 lb

## 2021-07-26 DIAGNOSIS — N1832 Chronic kidney disease, stage 3b: Secondary | ICD-10-CM | POA: Diagnosis not present

## 2021-07-26 DIAGNOSIS — R062 Wheezing: Secondary | ICD-10-CM | POA: Diagnosis not present

## 2021-07-26 DIAGNOSIS — Z0000 Encounter for general adult medical examination without abnormal findings: Secondary | ICD-10-CM

## 2021-07-26 DIAGNOSIS — J069 Acute upper respiratory infection, unspecified: Secondary | ICD-10-CM

## 2021-07-26 DIAGNOSIS — I10 Essential (primary) hypertension: Secondary | ICD-10-CM

## 2021-07-26 DIAGNOSIS — E782 Mixed hyperlipidemia: Secondary | ICD-10-CM

## 2021-07-26 DIAGNOSIS — Z79891 Long term (current) use of opiate analgesic: Secondary | ICD-10-CM | POA: Diagnosis not present

## 2021-07-26 DIAGNOSIS — K219 Gastro-esophageal reflux disease without esophagitis: Secondary | ICD-10-CM

## 2021-07-26 DIAGNOSIS — E559 Vitamin D deficiency, unspecified: Secondary | ICD-10-CM | POA: Diagnosis not present

## 2021-07-26 DIAGNOSIS — G479 Sleep disorder, unspecified: Secondary | ICD-10-CM | POA: Diagnosis not present

## 2021-07-26 DIAGNOSIS — Z79899 Other long term (current) drug therapy: Secondary | ICD-10-CM

## 2021-07-26 MED ORDER — TEMAZEPAM 30 MG PO CAPS: 30.0000 mg | ORAL_CAPSULE | Freq: Every day | ORAL | 5 refills | Status: DC

## 2021-07-26 MED ORDER — ALBUTEROL SULFATE HFA 108 (90 BASE) MCG/ACT IN AERS: 2.0000 | INHALATION_SPRAY | Freq: Four times a day (QID) | RESPIRATORY_TRACT | 1 refills | Status: DC | PRN

## 2021-07-26 NOTE — Progress Notes (Signed)
Assessment & Plan:  1. Well adult exam Preventive health education provided. - CBC with Differential/Platelet - CMP14+EGFR - Lipid panel  2-3. Difficulty sleeping/Controlled substance agreement signed Well controlled on current regimen. Controlled substance agreement signed today. Urine drug screen collected today. PDMP reviewed with no concerning findings. - temazepam (RESTORIL) 30 MG capsule; Take 1 capsule (30 mg total) by mouth at bedtime.  Dispense: 30 capsule; Refill: 5 - ToxASSURE Select 13 (MW), Urine  4. Gastroesophageal reflux disease, unspecified whether esophagitis present Well controlled on current regimen.   5. Vitamin D deficiency - VITAMIN D 25 Hydroxy (Vit-D Deficiency, Fractures)  6. Mixed hyperlipidemia Well controlled on current regimen.  - CMP14+EGFR - Lipid panel  7. Stage 3b chronic kidney disease ([place]) Managed by nephrology. - CMP14+EGFR  8. Essential hypertension Well controlled on current regimen.  - CBC with Differential/Platelet - CMP14+EGFR - Lipid panel  9. Viral URI Symptom management.  10. Wheezing - albuterol (VENTOLIN HFA) 108 (90 Base) MCG/ACT inhaler; Inhale 2 puffs into the lungs every 6 (six) hours as needed for wheezing or shortness of breath.  Dispense: 18 g; Refill: 1   Follow-up: Return in about 6 months (around 01/23/2022) for follow-up of chronic medication conditions.   Isaac Crater, NP Student  I personally was present during the history, physical exam, and medical decision-making activities of this service and have verified that the service and findings are accurately documented in the nurse practitioner student's note.  Hendricks Limes, MSN, APRN, FNP-C [facility]  Subjective:  Patient ID: Isaac Beltran, male    DOB: Dec 07, 1940  Age: 81 y.o. MRN: [id number]  Patient Care Team: Isaac Brooklyn, FNP as PCP - General (Family Medicine) Isaac Oh, MD as Consulting Physician (Nephrology)    CC:  Chief Complaint  Patient presents with   Annual Exam   Cough   Nasal Congestion    X 4 days    HPI Isaac Beltran presents for annual physical.   Hypertension: he has previously been well controlled with just chlorthalidone 25 mg daily. He is initially high today.   GERD: controlled with omeprazole.  CKD: followed by nephrology.   Insomnia: he has had ongoing issues with insomnia and has failed Trazodone, Belsomra, and Ambien. He was recently started on Temazepam which is working well for him.  Health Maintenance: Occupation: retired, Marital status: widowed, Substance use: none Diet: regular, Exercise: walking Last eye exam: > 5 years Last dental exam: never  Immunizations:  Flu Vaccine: declined Tdap Vaccine: declined  Shingrix Vaccine: declined  COVID-19 Vaccine: declined Pneumonia Vaccine: declined  Depression/Anxiety: Depression screen [facility] 2/9 07/26/2021 01/31/2021 01/26/2021  Decreased Interest 0 0 0  Down, Depressed, Hopeless 0 0 0  PHQ - 2 Score 0 0 0  Altered sleeping 1 0 0  Tired, decreased energy _0 Change in appetite 0 0 0  Feeling bad or failure about yourself  0 0 0  Trouble concentrating 0 0 0  Moving slowly or fidgety/restless 0 0 0  Suicidal thoughts 0 0 0  PHQ-9 Score _1 Difficult doing work/chores Not difficult at all Not difficult at all Not difficult at all   GAD 7 : Generalized Anxiety Score 07/26/2021 01/26/2021  Nervous, Anxious, on Edge 0 0  Control/stop worrying 0 0  Worry too much - different things 0 0  Trouble relaxing 0 0  Restless 1 0  Easily annoyed or irritable 0 0  Afraid - awful  might happen 0 0  Total GAD 7 Score 1 0  Anxiety Difficulty Not difficult at all Not difficult at all    New complaints:  He reports a 3-4 days history of cough, shortness of breath, intermittent wheezing, runny nose, and watery eyes. He states he is getting better but still has some intermittent wheezing and cough. He has been taking  Nyquil, Zyrtec, and Mucinex which has been helping some.    Review of Systems  Constitutional: Negative.  Negative for chills, fever, malaise/fatigue and weight loss.  HENT: Negative.  Negative for congestion, ear discharge, ear pain, hearing loss and sinus pain.   Eyes: Negative.  Negative for blurred vision, pain, discharge and redness.  Respiratory:  Positive for cough, shortness of breath and wheezing.   Cardiovascular: Negative.  Negative for chest pain, palpitations, orthopnea and leg swelling.  Gastrointestinal: Negative.  Negative for abdominal pain, constipation, diarrhea, heartburn, nausea and vomiting.  Genitourinary: Negative.  Negative for dysuria, frequency and urgency.  Musculoskeletal: Negative.  Negative for back pain, falls, joint pain, myalgias and neck pain.  Skin: Negative.  Negative for itching and rash.  Neurological: Negative.  Negative for dizziness, tremors, weakness and headaches.  Endo/Heme/Allergies: Negative.  Negative for environmental allergies and polydipsia. Does not bruise/bleed easily.  Psychiatric/Behavioral: Negative.  Negative for depression, memory loss and substance abuse. The patient is not nervous/anxious and does not have insomnia.     Current Outpatient Medications:    acetaminophen (TYLENOL) 500 MG tablet, Take 500 mg by mouth every 6 (six) hours as needed., Disp: , Rfl:    albuterol (VENTOLIN HFA) 108 (90 Base) MCG/ACT inhaler, Inhale 2 puffs into the lungs every 6 (six) hours as needed for wheezing or shortness of breath., Disp: 18 g, Rfl: 1   aspirin EC 81 MG tablet, Take 81 mg by mouth daily. Swallow whole., Disp: , Rfl:    chlorthalidone (HYGROTON) 25 MG tablet, Take 25 mg by mouth every morning., Disp: , Rfl:    omeprazole (PRILOSEC) 20 MG capsule, Take 1 capsule by mouth daily., Disp: , Rfl:    OVER THE COUNTER MEDICATION, CBD OIL, Disp: , Rfl:    sildenafil (REVATIO) 20 MG tablet, Take 1-5 tablets (20-100 mg total) by mouth daily as  needed., Disp: 30 tablet, Rfl: 2   temazepam (RESTORIL) 30 MG capsule, Take 1 capsule (30 mg total) by mouth at bedtime., Disp: 30 capsule, Rfl: 5  Allergies  Allergen Reactions   Simvastatin Other (See Comments)    antihyperlipidemics    Past Medical History:  Diagnosis Date   Anemia    CKD (chronic kidney disease)    GERD (gastroesophageal reflux disease)    Hyperlipidemia    Hyperparathyroidism ([place])    Hypertension    Lumbar spinal stenosis    Psoriasis    Vitamin D deficiency     No past surgical history on file.  Family History  Problem Relation Age of Onset   Alzheimer's disease Mother    Heart attack Father     Social History   Socioeconomic History   Marital status: Widowed    Spouse name: Not on file   Number of children: Not on file   Years of education: Not on file   Highest education level: Not on file  Occupational History   Occupation: retired  Tobacco Use   Smoking status: Former    Types: Cigarettes    Quit date: 04/13/1985    Years since quitting: 36.3   Smokeless tobacco:  Never  Vaping Use   Vaping Use: Never used  Substance and Sexual Activity   Alcohol use: Yes    Alcohol/week: 1.0 standard drink    Types: 1 Cans of beer per week    Comment: occ   Drug use: Never   Sexual activity: Not on file  Other Topics Concern   Not on file  Social History Narrative   He cares for an adopted child who is now 17 - 01/31/21   Social Determinants of Health   Financial Resource Strain: Low Risk    Difficulty of Paying Living Expenses: Not hard at all  Food Insecurity: No Food Insecurity   Worried About Charity fundraiser in the Last Year: Never true   [place] in the Last Year: Never true  Transportation Needs: No Transportation Needs   Lack of Transportation (Medical): No   Lack of Transportation (Non-Medical): No  Physical Activity: Insufficiently Active   Days of Exercise per Week: 7 days   Minutes of Exercise per Session: 20 min   Stress: No Stress Concern Present   Feeling of Stress : Not at all  Social Connections: Moderately Integrated   Frequency of Communication with Friends and Family: More than three times a week   Frequency of Social Gatherings with Friends and Family: Twice a week   Attends Religious Services: 1 to 4 times per year   Active Member of [employer] or Organizations: Yes   Attends Archivist Meetings: 1 to 4 times per year   Marital Status: Widowed  Human resources officer Violence: Not At Risk   Fear of Current or Ex-Partner: No   Emotionally Abused: No   Physically Abused: No   Sexually Abused: No      Objective:    BP 135/70 Comment: manual   Pulse 71    Temp 97.9 F (36.6 C) (Temporal)    Ht _0  (1.753 m)    Wt 210 lb 3.2 oz (95.3 kg)    SpO2 95%    BMI 31.04 kg/m   Wt Readings from Last 3 Encounters:  07/26/21 210 lb 3.2 oz (95.3 kg)  01/31/21 209 lb (94.8 kg)  01/26/21 208 lb 9.6 oz (94.6 kg)    Physical Exam Vitals reviewed.  Constitutional:      General: He is not in acute distress.    Appearance: Normal appearance. He is not ill-appearing, toxic-appearing or diaphoretic.  HENT:     Head: Normocephalic and atraumatic.     Right Ear: Tympanic membrane, ear canal and external ear normal. There is no impacted cerumen.     Left Ear: Tympanic membrane, ear canal and external ear normal. There is no impacted cerumen.     Nose: Congestion and rhinorrhea present.     Mouth/Throat:     Mouth: Mucous membranes are moist.     Pharynx: Oropharynx is clear. Posterior oropharyngeal erythema present. No oropharyngeal exudate.  Eyes:     General: No scleral icterus.       Right eye: No discharge.        Left eye: No discharge.     Extraocular Movements: Extraocular movements intact.     Conjunctiva/sclera: Conjunctivae normal.     Pupils: Pupils are equal, round, and reactive to light.  Cardiovascular:     Rate and Rhythm: Normal rate and regular rhythm.     Pulses: Normal  pulses.     Heart sounds: Normal heart sounds. No murmur heard.   No  friction rub. No gallop.  Pulmonary:     Effort: Pulmonary effort is normal. No respiratory distress.     Breath sounds: No stridor. Wheezing present. No rhonchi or rales.  Abdominal:     General: Bowel sounds are normal. There is no distension.     Palpations: Abdomen is soft. There is no mass.     Tenderness: There is no abdominal tenderness.     Hernia: No hernia is present.  Musculoskeletal:        General: Normal range of motion.     Cervical back: Normal range of motion.     Right lower leg: No edema.     Left lower leg: No edema.  Skin:    General: Skin is warm and dry.     Capillary Refill: Capillary refill takes less than 2 seconds.  Neurological:     General: No focal deficit present.     Mental Status: He is alert and oriented to person, place, and time. Mental status is at baseline.     Motor: No weakness.  Psychiatric:        Mood and Affect: Mood normal.        Behavior: Behavior normal.        Thought Content: Thought content normal.        Judgment: Judgment normal.   No results found for: TSH Lab Results  Component Value Date   WBC 6.4 04/13/2020   HGB 13.1 04/13/2020   HCT 38.9 04/13/2020   MCV 89 04/13/2020   PLT 270 04/13/2020   Lab Results  Component Value Date   NA 143 04/13/2020   K 4.7 04/13/2020   CO2 26 04/13/2020   GLUCOSE 100 (H) 04/13/2020   BUN 26 04/13/2020   CREATININE 1.58 (H) 04/13/2020   BILITOT 0.7 04/13/2020   ALKPHOS 63 04/13/2020   AST 22 04/13/2020   ALT 19 04/13/2020   PROT 7.0 04/13/2020   ALBUMIN 4.6 04/13/2020   CALCIUM 10.0 04/13/2020   Lab Results  Component Value Date   CHOL 177 04/13/2020   Lab Results  Component Value Date   HDL 36 (L) 04/13/2020   Lab Results  Component Value Date   LDLCALC 103 (H) 04/13/2020   Lab Results  Component Value Date   TRIG 223 (H) 04/13/2020   Lab Results  Component Value Date   CHOLHDL 4.9  04/13/2020   No results found for: HGBA1C

## 2021-07-27 ENCOUNTER — Ambulatory Visit: Payer: Medicare Other | Admitting: Family Medicine

## 2021-07-27 LAB — CMP14+EGFR
ALT: 18 IU/L (ref 0–44)
AST: 22 IU/L (ref 0–40)
Albumin/Globulin Ratio: 1.6 (ref 1.2–2.2)
Albumin: 4.4 g/dL (ref 3.6–4.6)
Alkaline Phosphatase: 71 IU/L (ref 44–121)
BUN/Creatinine Ratio: 20 (ref 10–24)
BUN: 39 mg/dL — ABNORMAL HIGH (ref 8–27)
Bilirubin Total: 0.5 mg/dL (ref 0.0–1.2)
CO2: 24 mmol/L (ref 20–29)
Calcium: 10 mg/dL (ref 8.6–10.2)
Chloride: 103 mmol/L (ref 96–106)
Creatinine, Ser: 1.92 mg/dL — ABNORMAL HIGH (ref 0.76–1.27)
Globulin, Total: 2.7 g/dL (ref 1.5–4.5)
Glucose: 103 mg/dL — ABNORMAL HIGH (ref 70–99)
Potassium: 4.9 mmol/L (ref 3.5–5.2)
Sodium: 143 mmol/L (ref 134–144)
Total Protein: 7.1 g/dL (ref 6.0–8.5)
eGFR: 34 mL/min/{1.73_m2} — ABNORMAL LOW (ref >59)

## 2021-07-27 LAB — LIPID PANEL
Chol/HDL Ratio: 4 ratio (ref 0.0–5.0)
Cholesterol, Total: 174 mg/dL (ref 100–199)
HDL: 44 mg/dL (ref >39)
LDL Chol Calc (NIH): 103 mg/dL — ABNORMAL HIGH (ref 0–99)
Triglycerides: 155 mg/dL — ABNORMAL HIGH (ref 0–149)
VLDL Cholesterol Cal: 27 mg/dL (ref 5–40)

## 2021-07-27 LAB — CBC WITH DIFFERENTIAL/PLATELET
Basophils Absolute: 0.1 10*3/uL (ref 0.0–0.2)
Basos: 1 %
EOS (ABSOLUTE): 0.6 10*3/uL — ABNORMAL HIGH (ref 0.0–0.4)
Eos: 8 %
Hematocrit: 38.9 % (ref 37.5–51.0)
Hemoglobin: 13 g/dL (ref 13.0–17.7)
Immature Grans (Abs): 0 10*3/uL (ref 0.0–0.1)
Immature Granulocytes: 0 %
Lymphocytes Absolute: 2.8 10*3/uL (ref 0.7–3.1)
Lymphs: 39 %
MCH: 29.3 pg (ref 26.6–33.0)
MCHC: 33.4 g/dL (ref 31.5–35.7)
MCV: 88 fL (ref 79–97)
Monocytes Absolute: 0.7 10*3/uL (ref 0.1–0.9)
Monocytes: 9 %
Neutrophils Absolute: 3.2 10*3/uL (ref 1.4–7.0)
Neutrophils: 43 %
Platelets: 309 10*3/uL (ref 150–450)
RBC: 4.44 x10E6/uL (ref 4.14–5.80)
RDW: 14 % (ref 11.6–15.4)
WBC: 7.4 10*3/uL (ref 3.4–10.8)

## 2021-07-27 LAB — VITAMIN D 25 HYDROXY (VIT D DEFICIENCY, FRACTURES): Vit D, 25-Hydroxy: 68.1 ng/mL (ref 30.0–100.0)

## 2021-07-29 ENCOUNTER — Encounter: Payer: Self-pay | Admitting: Family Medicine

## 2021-07-29 DIAGNOSIS — Z79899 Other long term (current) drug therapy: Secondary | ICD-10-CM | POA: Insufficient documentation

## 2021-07-31 LAB — TOXASSURE SELECT 13 (MW), URINE

## 2021-08-06 ENCOUNTER — Telehealth: Payer: Self-pay | Admitting: Family Medicine

## 2021-08-06 NOTE — Telephone Encounter (Signed)
Pt confirmed that he does see a nephrologist yearly. Pt now concerned that he needs to see them sooner since Brayton El is mentioning [place] and he feels tired all of the time. Pt will call and schedule an appt with them.

## 2021-08-06 NOTE — Telephone Encounter (Signed)
Patient would like to know if you think he should see the nephrologist sooner than his normal appointment 7 months from now.   He is aware you are out of the office today.

## 2021-08-06 NOTE — Telephone Encounter (Signed)
His kidney function has decreased some since I last did lab work the previous year. I am sure his nephrologist has also done some, I just cannot see their records. Since he is already established he is fine. When patient's don't have a nephrologist I treat them with [place].

## 2021-08-15 ENCOUNTER — Other Ambulatory Visit: Payer: Self-pay | Admitting: Family Medicine

## 2021-08-15 DIAGNOSIS — Z79899 Other long term (current) drug therapy: Secondary | ICD-10-CM

## 2021-08-15 DIAGNOSIS — G479 Sleep disorder, unspecified: Secondary | ICD-10-CM

## 2021-11-14 ENCOUNTER — Ambulatory Visit (INDEPENDENT_AMBULATORY_CARE_PROVIDER_SITE_OTHER): Payer: Medicare Other | Admitting: Nurse Practitioner

## 2021-11-14 ENCOUNTER — Encounter: Payer: Self-pay | Admitting: Nurse Practitioner

## 2021-11-14 VITALS — BP 146/77 | HR 94 | Temp 98.1°F | Resp 20 | Ht 69.0 in | Wt 209.0 lb

## 2021-11-14 DIAGNOSIS — M10072 Idiopathic gout, left ankle and foot: Secondary | ICD-10-CM

## 2021-11-14 MED ORDER — COLCHICINE 0.6 MG PO TABS: 0.6000 mg | ORAL_TABLET | Freq: Every day | ORAL | 0 refills | Status: DC

## 2021-11-14 MED ORDER — METHYLPREDNISOLONE ACETATE 40 MG/ML IJ SUSP
40.0000 mg | Freq: Once | INTRAMUSCULAR | Status: AC
  Administered 2021-11-14: 40 mg via INTRAMUSCULAR

## 2021-11-14 NOTE — Patient Instructions (Signed)
Gout  Gout is painful swelling of your joints. Gout is a type of arthritis. It is caused by having too much uric acid in your body. Uric acid is a chemical that is made when your body breaks down substances called purines. If your body has too much uric acid, sharp crystals can form and build up in your joints. This causes pain and swelling. Gout attacks can happen quickly and be very painful (acute gout). Over time, the attacks can affect more joints and happen more often (chronic gout). What are the causes? Gout is caused by too much uric acid in your blood. This can happen because: Your kidneys do not remove enough uric acid from your blood. Your body makes too much uric acid. You eat too many foods that are high in purines. These foods include organ meats, some seafood, and beer. Trauma or stress can bring on an attack. What increases the risk? Having a family history of gout. Being male and middle-aged. Being male and having gone through menopause. Having an organ transplant. Taking certain medicines. Having certain conditions, such as: Being very overweight (obese). Lead poisoning. Kidney disease. A skin condition called psoriasis. Other risks include: Losing weight too quickly. Not having enough water in the body (being dehydrated). Drinking alcohol, especially beer. Drinking beverages that are sweetened with a type of sugar called fructose. What are the signs or symptoms? An attack of acute gout often starts at night and usually happens in just one joint. The most common place is the big toe. Other joints that may be affected include joints of the feet, ankle, knee, fingers, wrist, or elbow. Symptoms may include: Very bad pain. Warmth. Swelling. Stiffness. Tenderness. The affected joint may be very painful to touch. Shiny, red, or purple skin. Chills and fever. Chronic gout may cause symptoms more often. More joints may be involved. You may also have white or yellow lumps  (tophi) on your hands or feet or in other areas near your joints. How is this treated? Treatment for an acute attack may include medicines for pain and swelling, such as: NSAIDs, such as ibuprofen. Steroids taken by mouth or injected into a joint. Colchicine. This can be given by mouth or through an IV tube. Treatment to prevent future attacks may include: Taking small doses of NSAIDs or colchicine daily. Using a medicine that reduces uric acid levels in your blood, such as allopurinol. Making changes to your diet. You may need to see a food expert (dietitian) about what to eat and drink to prevent gout. Follow these instructions at home: During a gout attack  If told, put ice on the painful area. To do this: Put ice in a plastic bag. Place a towel between your skin and the bag. Leave the ice on for 20 minutes, 2-3 times a day. Take off the ice if your skin turns bright red. This is very important. If you cannot feel pain, heat, or cold, you have a greater risk of damage to the area. Raise the painful joint above the level of your heart as often as you can. Rest the joint as much as possible. If the joint is in your leg, you may be given crutches. Follow instructions from your doctor about what you cannot eat or drink. Avoiding future gout attacks Eat a low-purine diet. Avoid foods and drinks such as: Liver. Kidney. Anchovies. Asparagus. Herring. Mushrooms. Mussels. Beer. Stay at a healthy weight. If you want to lose weight, talk with your doctor. Do not   lose weight too fast. Start or continue an exercise plan as told by your doctor. Eating and drinking Avoid drinks sweetened by fructose. Drink enough fluids to keep your pee (urine) pale yellow. If you drink alcohol: Limit how much you have to: 0-1 drink a day for women who are not pregnant. 0-2 drinks a day for men. Know how much alcohol is in a drink. In the [place] one drink equals one 12 oz bottle of beer (355 mL), one 5 oz  glass of wine (148 mL), or one 1 oz glass of hard liquor (44 mL). General instructions Take over-the-counter and prescription medicines only as told by your doctor. Ask your doctor if you should avoid driving or using machines while you are taking your medicine. Return to your normal activities when your doctor says that it is safe. Keep all follow-up visits. Where to find more information National Institutes of Health: www.niams.nih.gov Contact a doctor if: You have another gout attack. You still have symptoms of a gout attack after 10 days of treatment. You have problems (side effects) because of your medicines. You have chills or a fever. You have burning pain when you pee (urinate). You have pain in your lower back or belly. Get help right away if: You have very bad pain. Your pain cannot be controlled. You cannot pee. Summary Gout is painful swelling of the joints. The most common site of pain is the big toe, but it can affect other joints. Medicines and avoiding some foods can help to prevent and treat gout attacks. This information is not intended to replace advice given to you by your health care provider. Make sure you discuss any questions you have with your health care provider. Document Revised: 04/04/2021 Document Reviewed: 04/04/2021 Elsevier Patient Education  2023 Elsevier Inc.  

## 2021-11-14 NOTE — Progress Notes (Signed)
? ?Acute Office Visit ? ?Subjective:  ? ?  ?Patient ID: Isaac Beltran, male    DOB: 24-Mar-1941, 81 y.o.   MRN: [id number] ? ?Chief Complaint  ?Patient presents with  ? left foot pain   ?  X 3-4 days - no injury   ? ? ?HPI ?Patient is in today for Pain ? ?He reports new onset left lateral foot pain. was not an injury that may have caused the pain. The pain started a few days ago and is worsening. The pain does not radiate . The pain is described as aching and soreness, is 8/10 in intensity, occurring constantly. Symptoms are worse in the: morning, mid-day, afternoon, evening  ?Aggravating factors: palpation  Relieving factors: none.  ?He has tried acetaminophen with no relief.  ? ? ?Review of Systems  ?Constitutional: Negative.   ?HENT: Negative.    ?Eyes: Negative.   ?Respiratory: Negative.    ?Cardiovascular: Negative.   ?Musculoskeletal:  Positive for joint pain.  ?Skin: Negative.   ?All other systems reviewed and are negative. ? ? ?   ?Objective:  ?  ?BP (!) 146/77   Pulse 94   Temp 98.1 ?F (36.7 ?C)   Resp 20   Ht 5\' 9"  (1.753 m)   Wt 209 lb (94.8 kg)   SpO2 96%   BMI 30.86 kg/m?  ?BP Readings from Last 3 Encounters:  ?11/14/21 (!) 146/77  ?07/26/21 135/70  ?01/26/21 (!) 144/81  ? ?Wt Readings from Last 3 Encounters:  ?11/14/21 209 lb (94.8 kg)  ?07/26/21 210 lb 3.2 oz (95.3 kg)  ?01/31/21 209 lb (94.8 kg)  ? ?  ? ?Physical Exam ?Vitals reviewed.  ?Constitutional:   ?   Appearance: Normal appearance.  ?HENT:  ?   Right Ear: External ear normal.  ?   Left Ear: External ear normal.  ?   Nose: Nose normal.  ?   Mouth/Throat:  ?   Mouth: Mucous membranes are moist.  ?   Pharynx: Oropharynx is clear.  ?Eyes:  ?   Conjunctiva/sclera: Conjunctivae normal.  ?Cardiovascular:  ?   Rate and Rhythm: Normal rate and regular rhythm.  ?   Pulses: Normal pulses.  ?   Heart sounds: Normal heart sounds.  ?Pulmonary:  ?   Effort: Pulmonary effort is normal.  ?   Breath sounds: Normal breath sounds.  ?Abdominal:  ?    General: Bowel sounds are normal.  ?Musculoskeletal:  ?   Left foot: Decreased range of motion. Swelling and tenderness present.  ?Skin: ?   General: Skin is warm.  ?Neurological:  ?   Mental Status: He is alert.  ? ? ?No results found for any visits on 11/14/21. ? ? ?   ?Assessment & Plan:  ?Left lateral foot pain and swelling.  ?-take medication as prescribed ?-completed uric acid labs, with cbc- results pending ?Colchicine for pain and inflammation ?Education provided to patient with printed hand out given ? ?Follow up with worsening unresolved symptoms ?- ?Problem List Items Addressed This Visit   ?None ?Visit Diagnoses   ? ? Acute idiopathic gout of left foot    -  Primary  ? Relevant Medications  ? colchicine 0.6 MG tablet  ? methylPREDNISolone acetate (DEPO-MEDROL) injection 40 mg (Completed)  ? Other Relevant Orders  ? Uric Acid  ? CBC with Differential  ? ?  ? ? ?Meds ordered this encounter  ?Medications  ? colchicine 0.6 MG tablet  ?  Sig: Take 1 tablet (0.6 mg  total) by mouth daily.  ?  Dispense:  30 tablet  ?  Refill:  0  ?  Order Specific Question:   Supervising Provider  ?  AnswerClaretta Fraise [id number]  ? methylPREDNISolone acetate (DEPO-MEDROL) injection 40 mg  ? ? ?Return if symptoms worsen or fail to improve. ? ?Ivy Lynn, NP ? ? ?

## 2021-11-15 LAB — CBC WITH DIFFERENTIAL/PLATELET
Basophils Absolute: 0.1 10*3/uL (ref 0.0–0.2)
Basos: 1 %
EOS (ABSOLUTE): 0.3 10*3/uL (ref 0.0–0.4)
Eos: 4 %
Hematocrit: 35.3 % — ABNORMAL LOW (ref 37.5–51.0)
Hemoglobin: 12.2 g/dL — ABNORMAL LOW (ref 13.0–17.7)
Immature Grans (Abs): 0 10*3/uL (ref 0.0–0.1)
Immature Granulocytes: 0 %
Lymphocytes Absolute: 1.6 10*3/uL (ref 0.7–3.1)
Lymphs: 20 %
MCH: 29.8 pg (ref 26.6–33.0)
MCHC: 34.6 g/dL (ref 31.5–35.7)
MCV: 86 fL (ref 79–97)
Monocytes Absolute: 0.7 10*3/uL (ref 0.1–0.9)
Monocytes: 8 %
Neutrophils Absolute: 5.4 10*3/uL (ref 1.4–7.0)
Neutrophils: 67 %
Platelets: 284 10*3/uL (ref 150–450)
RBC: 4.1 x10E6/uL — ABNORMAL LOW (ref 4.14–5.80)
RDW: 14.1 % (ref 11.6–15.4)
WBC: 8 10*3/uL (ref 3.4–10.8)

## 2021-11-15 LAB — URIC ACID: Uric Acid: 7.9 mg/dL (ref 3.8–8.4)

## 2021-12-06 ENCOUNTER — Other Ambulatory Visit: Payer: Self-pay | Admitting: Nurse Practitioner

## 2021-12-06 DIAGNOSIS — M10072 Idiopathic gout, left ankle and foot: Secondary | ICD-10-CM

## 2021-12-24 ENCOUNTER — Other Ambulatory Visit: Payer: Self-pay | Admitting: Family Medicine

## 2021-12-24 DIAGNOSIS — M10072 Idiopathic gout, left ankle and foot: Secondary | ICD-10-CM

## 2022-01-10 ENCOUNTER — Other Ambulatory Visit: Payer: Self-pay | Admitting: Family Medicine

## 2022-01-10 DIAGNOSIS — E559 Vitamin D deficiency, unspecified: Secondary | ICD-10-CM

## 2022-01-23 ENCOUNTER — Ambulatory Visit (INDEPENDENT_AMBULATORY_CARE_PROVIDER_SITE_OTHER): Payer: Medicare Other | Admitting: Family Medicine

## 2022-01-23 ENCOUNTER — Encounter: Payer: Self-pay | Admitting: Family Medicine

## 2022-01-23 VITALS — BP 143/72 | HR 63 | Temp 98.0°F | Ht 69.0 in | Wt 209.2 lb

## 2022-01-23 DIAGNOSIS — Z79899 Other long term (current) drug therapy: Secondary | ICD-10-CM

## 2022-01-23 DIAGNOSIS — E782 Mixed hyperlipidemia: Secondary | ICD-10-CM | POA: Diagnosis not present

## 2022-01-23 DIAGNOSIS — K219 Gastro-esophageal reflux disease without esophagitis: Secondary | ICD-10-CM | POA: Diagnosis not present

## 2022-01-23 DIAGNOSIS — G479 Sleep disorder, unspecified: Secondary | ICD-10-CM

## 2022-01-23 DIAGNOSIS — N1832 Chronic kidney disease, stage 3b: Secondary | ICD-10-CM | POA: Diagnosis not present

## 2022-01-23 DIAGNOSIS — L309 Dermatitis, unspecified: Secondary | ICD-10-CM

## 2022-01-23 DIAGNOSIS — I10 Essential (primary) hypertension: Secondary | ICD-10-CM | POA: Diagnosis not present

## 2022-01-23 MED ORDER — BETAMETHASONE DIPROPIONATE 0.05 % EX CREA: TOPICAL_CREAM | Freq: Two times a day (BID) | CUTANEOUS | 2 refills | Status: DC

## 2022-01-23 MED ORDER — ESZOPICLONE 1 MG PO TABS: 1.0000 mg | ORAL_TABLET | Freq: Every evening | ORAL | 2 refills | Status: DC | PRN

## 2022-01-23 NOTE — Progress Notes (Signed)
Assessment & Plan:  1. Essential hypertension Well controlled on current regimen.  - CMP14+EGFR  2. Mixed hyperlipidemia Well controlled on current regimen.  - CMP14+EGFR  3. Stage 3b chronic kidney disease ([place]) Managed by nephrology.  - CMP14+EGFR  4. Gastroesophageal reflux disease without esophagitis Well controlled on current regimen.  - CMP14+EGFR  5. Difficulty sleeping Uncontrolled. Started Lunesta. - eszopiclone (LUNESTA) 1 MG TABS tablet; Take 1 tablet (1 mg total) by mouth at bedtime as needed for sleep. Take immediately before bedtime  Dispense: 30 tablet; Refill: 2 - CMP14+EGFR  6. Controlled substance agreement signed Signed for sleep aid. Controlled substance agreement in place. Urine drug screen as expected. PDMP reviewed with no concerning findings.  - eszopiclone (LUNESTA) 1 MG TABS tablet; Take 1 tablet (1 mg total) by mouth at bedtime as needed for sleep. Take immediately before bedtime  Dispense: 30 tablet; Refill: 2  7. Eczema of lower extremity Well controlled on current regimen.  - betamethasone dipropionate 0.05 % cream; Apply topically 2 (two) times daily.  Dispense: 45 g; Refill: 2   Return in about 3 months (around 04/25/2022) for sleep, labs for CKD.  Hendricks Limes, MSN, APRN, FNP-C [facility]  Subjective:    Patient ID: Isaac Beltran, male    DOB: Nov 30, 1940, 81 y.o.   MRN: [id number]  Patient Care Team: Loman Brooklyn, FNP as PCP - General (Family Medicine) Edrick Oh, MD as Consulting Physician (Nephrology)   Chief Complaint:  Chief Complaint  Patient presents with   Medical Management of Chronic Issues   Eczema    Bilateral legs     HPI: Isaac Beltran is a 81 y.o. male presenting on 01/23/2022 for Medical Management of Chronic Issues and Eczema (Bilateral legs )  Hypertension: does not monitor BP at home.   CKD managed by Dr. Justin Mend at [facility].   Insomnia: he has had ongoing  issues with insomnia and has failed Trazodone, Belsomra, and Ambien. He is not doing well with Temazepam.  GERD: controlled with omeprazole.  New complaints: Patient is requesting a refill on betamethasone he uses for eczema on his legs.   Social history:  Relevant past medical, surgical, family and social history reviewed and updated as indicated. Interim medical history since our last visit reviewed.  Allergies and medications reviewed and updated.  DATA REVIEWED: CHART IN EPIC  ROS: Negative unless specifically indicated above in HPI.    Current Outpatient Medications:    acetaminophen (TYLENOL) 500 MG tablet, Take 500 mg by mouth every 6 (six) hours as needed., Disp: , Rfl:    colchicine 0.6 MG tablet, TAKE 1 TABLET BY MOUTH EVERY DAY, Disp: 90 tablet, Rfl: 0   omeprazole (PRILOSEC) 20 MG capsule, Take 1 capsule by mouth daily., Disp: , Rfl:    sildenafil (REVATIO) 20 MG tablet, Take 1-5 tablets (20-100 mg total) by mouth daily as needed., Disp: 30 tablet, Rfl: 2   albuterol (VENTOLIN HFA) 108 (90 Base) MCG/ACT inhaler, Inhale 2 puffs into the lungs every 6 (six) hours as needed for wheezing or shortness of breath. (Patient not taking: Reported on 01/23/2022), Disp: 18 g, Rfl: 1   aspirin EC 81 MG tablet, Take 81 mg by mouth daily. Swallow whole. (Patient not taking: Reported on 11/14/2021), Disp: , Rfl:    chlorthalidone (HYGROTON) 25 MG tablet, Take 25 mg by mouth every morning. (Patient not taking: Reported on 01/23/2022), Disp: , Rfl:    OVER THE COUNTER MEDICATION,  CBD OIL (Patient not taking: Reported on 11/14/2021), Disp: , Rfl:    temazepam (RESTORIL) 30 MG capsule, Take 1 capsule (30 mg total) by mouth at bedtime. (Patient not taking: Reported on 11/14/2021), Disp: 30 capsule, Rfl: 5   Allergies  Allergen Reactions   Simvastatin Other (See Comments)    antihyperlipidemics   Past Medical History:  Diagnosis Date   Anemia    CKD (chronic kidney disease)    GERD  (gastroesophageal reflux disease)    Hyperlipidemia    Hyperparathyroidism ([place])    Hypertension    Lumbar spinal stenosis    Psoriasis    Vitamin D deficiency     History reviewed. No pertinent surgical history.  Social History   Socioeconomic History   Marital status: Widowed    Spouse name: Not on file   Number of children: Not on file   Years of education: Not on file   Highest education level: Not on file  Occupational History   Occupation: retired  Tobacco Use   Smoking status: Former    Types: Cigarettes    Quit date: 04/13/1985    Years since quitting: 36.8   Smokeless tobacco: Never  Vaping Use   Vaping Use: Never used  Substance and Sexual Activity   Alcohol use: Yes    Alcohol/week: 1.0 standard drink of alcohol    Types: 1 Cans of beer per week    Comment: occ   Drug use: Never   Sexual activity: Not on file  Other Topics Concern   Not on file  Social History Narrative   He cares for an adopted child who is now 77 - 01/31/21   Social Determinants of Health   Financial Resource Strain: Low Risk  (01/31/2021)   Overall Financial Resource Strain (CARDIA)    Difficulty of Paying Living Expenses: Not hard at all  Food Insecurity: No Food Insecurity (01/31/2021)   Hunger Vital Sign    Worried About Running Out of Food in the Last Year: Never true    [place] in the Last Year: Never true  Transportation Needs: No Transportation Needs (01/31/2021)   PRAPARE - Hydrologist (Medical): No    Lack of Transportation (Non-Medical): No  Physical Activity: Insufficiently Active (01/31/2021)   Exercise Vital Sign    Days of Exercise per Week: 7 days    Minutes of Exercise per Session: 20 min  Stress: No Stress Concern Present (01/31/2021)   [place]    Feeling of Stress : Not at all  Social Connections: Moderately Integrated (01/31/2021)   Social Connection and  Isolation Panel [NHANES]    Frequency of Communication with Friends and Family: More than three times a week    Frequency of Social Gatherings with Friends and Family: Twice a week    Attends Religious Services: 1 to 4 times per year    Active Member of [employer] or Organizations: Yes    Attends Archivist Meetings: 1 to 4 times per year    Marital Status: Widowed  Intimate Partner Violence: Not At Risk (01/31/2021)   Humiliation, Afraid, Rape, and Kick questionnaire    Fear of Current or Ex-Partner: No    Emotionally Abused: No    Physically Abused: No    Sexually Abused: No        Objective:    BP (!) 143/72   Pulse 63   Temp 98 F (36.7  C) (Temporal)   Ht '5\' 9"'  (1.753 m)   Wt 209 lb 3.2 oz (94.9 kg)   SpO2 95%   BMI 30.89 kg/m   Wt Readings from Last 3 Encounters:  01/23/22 209 lb 3.2 oz (94.9 kg)  11/14/21 209 lb (94.8 kg)  07/26/21 210 lb 3.2 oz (95.3 kg)    Physical Exam Vitals reviewed.  Constitutional:      General: He is not in acute distress.    Appearance: Normal appearance. He is obese. He is not ill-appearing, toxic-appearing or diaphoretic.  HENT:     Head: Normocephalic and atraumatic.  Eyes:     General: No scleral icterus.       Right eye: No discharge.        Left eye: No discharge.     Conjunctiva/sclera: Conjunctivae normal.  Cardiovascular:     Rate and Rhythm: Normal rate and regular rhythm.     Heart sounds: Normal heart sounds. No murmur heard.    No friction rub. No gallop.  Pulmonary:     Effort: Pulmonary effort is normal. No respiratory distress.     Breath sounds: Normal breath sounds. No stridor. No wheezing, rhonchi or rales.  Abdominal:     General: Abdomen is flat.     Palpations: Abdomen is soft. There is no shifting dullness or mass.     Tenderness: There is no abdominal tenderness.     Hernia: There is no hernia in the left inguinal area.  Musculoskeletal:        General: Normal range of motion.     Cervical back:  Normal range of motion.  Skin:    General: Skin is warm and dry.     Comments: Raised scaly patch LLE.  Neurological:     Mental Status: He is alert and oriented to person, place, and time. Mental status is at baseline.  Psychiatric:        Mood and Affect: Mood normal.        Behavior: Behavior normal.        Thought Content: Thought content normal.        Judgment: Judgment normal.     No results found for: "TSH" Lab Results  Component Value Date   WBC 8.0 11/14/2021   HGB 12.2 (L) 11/14/2021   HCT 35.3 (L) 11/14/2021   MCV 86 11/14/2021   PLT 284 11/14/2021   Lab Results  Component Value Date   NA 143 07/26/2021   K 4.9 07/26/2021   CO2 24 07/26/2021   GLUCOSE 103 (H) 07/26/2021   BUN 39 (H) 07/26/2021   CREATININE 1.92 (H) 07/26/2021   BILITOT 0.5 07/26/2021   ALKPHOS 71 07/26/2021   AST 22 07/26/2021   ALT 18 07/26/2021   PROT 7.1 07/26/2021   ALBUMIN 4.4 07/26/2021   CALCIUM 10.0 07/26/2021   EGFR 34 (L) 07/26/2021   Lab Results  Component Value Date   CHOL 174 07/26/2021   Lab Results  Component Value Date   HDL 44 07/26/2021   Lab Results  Component Value Date   LDLCALC 103 (H) 07/26/2021   Lab Results  Component Value Date   TRIG 155 (H) 07/26/2021   Lab Results  Component Value Date   CHOLHDL 4.0 07/26/2021   No results found for: "HGBA1C"

## 2022-01-24 LAB — CMP14+EGFR
ALT: 13 IU/L (ref 0–44)
AST: 17 IU/L (ref 0–40)
Albumin/Globulin Ratio: 1.7 (ref 1.2–2.2)
Albumin: 4.3 g/dL (ref 3.7–4.7)
Alkaline Phosphatase: 70 IU/L (ref 44–121)
BUN/Creatinine Ratio: 17 (ref 10–24)
BUN: 32 mg/dL — ABNORMAL HIGH (ref 8–27)
Bilirubin Total: 0.4 mg/dL (ref 0.0–1.2)
CO2: 22 mmol/L (ref 20–29)
Calcium: 9.5 mg/dL (ref 8.6–10.2)
Chloride: 106 mmol/L (ref 96–106)
Creatinine, Ser: 1.84 mg/dL — ABNORMAL HIGH (ref 0.76–1.27)
Globulin, Total: 2.5 g/dL (ref 1.5–4.5)
Glucose: 105 mg/dL — ABNORMAL HIGH (ref 70–99)
Potassium: 4.8 mmol/L (ref 3.5–5.2)
Sodium: 145 mmol/L — ABNORMAL HIGH (ref 134–144)
Total Protein: 6.8 g/dL (ref 6.0–8.5)
eGFR: 36 mL/min/{1.73_m2} — ABNORMAL LOW (ref >59)

## 2022-02-01 ENCOUNTER — Ambulatory Visit (INDEPENDENT_AMBULATORY_CARE_PROVIDER_SITE_OTHER): Payer: Medicare Other

## 2022-02-01 VITALS — Wt 209.0 lb

## 2022-02-01 DIAGNOSIS — Z0000 Encounter for general adult medical examination without abnormal findings: Secondary | ICD-10-CM | POA: Diagnosis not present

## 2022-02-01 NOTE — Progress Notes (Signed)
Subjective:   Isaac Beltran is a 81 y.o. male who presents for Medicare Annual/Subsequent preventive examination.  Virtual Visit via Telephone Note  I connected with  Detavious Rinn Bordley on 02/01/22 at 11:15 AM EDT by telephone and verified that I am speaking with the correct person using two identifiers.  Location: Patient: Home Provider: [facility] Persons participating in the virtual visit: patient/Nurse Health Advisor   I discussed the limitations, risks, security and privacy concerns of performing an evaluation and management service by telephone and the availability of in person appointments. The patient expressed understanding and agreed to proceed.  Interactive audio and video telecommunications were attempted between this nurse and patient, however failed, due to patient having technical difficulties OR patient did not have access to video capability.  We continued and completed visit with audio only.  Some vital signs may be absent or patient reported.   Joachim Carton E Demitria Hay, LPN   Review of Systems     Cardiac Risk Factors include: advanced age (>53men, >34 women);dyslipidemia;hypertension;male gender;obesity (BMI >30kg/m2);sedentary lifestyle     Objective:    Today's Vitals   02/01/22 1033  Weight: 209 lb (94.8 kg)   Body mass index is 30.86 kg/m.     02/01/2022   10:43 AM 01/31/2021   11:29 AM  Advanced Directives  Does Patient Have a Medical Advance Directive? Yes Yes  Type of Estate agent of [place];Living will Healthcare Power of [place];Living will  Copy of Healthcare Power of Attorney in Chart? No - copy requested No - copy requested    Current Medications (verified) Outpatient Encounter Medications as of 02/01/2022  Medication Sig   acetaminophen (TYLENOL) 500 MG tablet Take 500 mg by mouth every 6 (six) hours as needed.   betamethasone dipropionate 0.05 % cream Apply topically 2 (two) times daily.   Cholecalciferol (VITAMIN D3) 25 MCG  (1000 UT) CAPS Take by mouth.   eszopiclone (LUNESTA) 1 MG TABS tablet Take 1 tablet (1 mg total) by mouth at bedtime as needed for sleep. Take immediately before bedtime   omeprazole (PRILOSEC) 20 MG capsule Take 1 capsule by mouth daily.   colchicine 0.6 MG tablet TAKE 1 TABLET BY MOUTH EVERY DAY (Patient not taking: Reported on 02/01/2022)   sildenafil (REVATIO) 20 MG tablet Take 1-5 tablets (20-100 mg total) by mouth daily as needed. (Patient not taking: Reported on 02/01/2022)   No facility-administered encounter medications on file as of 02/01/2022.    Allergies (verified) Simvastatin   History: Past Medical History:  Diagnosis Date   Anemia    CKD (chronic kidney disease)    GERD (gastroesophageal reflux disease)    Hyperlipidemia    Hyperparathyroidism (HCC)    Hypertension    Lumbar spinal stenosis    Psoriasis    Vitamin D deficiency    History reviewed. No pertinent surgical history. Family History  Problem Relation Age of Onset   Alzheimer's disease Mother    Heart attack Father    Social History   Socioeconomic History   Marital status: Widowed    Spouse name: Not on file   Number of children: Not on file   Years of education: Not on file   Highest education level: Not on file  Occupational History   Occupation: retired  Tobacco Use   Smoking status: Former    Types: Cigarettes    Quit date: 04/13/1985    Years since quitting: 36.8   Smokeless tobacco: Never  Vaping Use   Vaping  Use: Never used  Substance and Sexual Activity   Alcohol use: Yes    Alcohol/week: 1.0 standard drink of alcohol    Types: 1 Cans of beer per week    Comment: occ   Drug use: Never   Sexual activity: Not on file  Other Topics Concern   Not on file  Social History Narrative   He cares for an adopted child who is now 35 - 02/01/22   Social Determinants of Health   Financial Resource Strain: Low Risk  (02/01/2022)   Overall Financial Resource Strain (CARDIA)    Difficulty  of Paying Living Expenses: Not hard at all  Food Insecurity: No Food Insecurity (02/01/2022)   Hunger Vital Sign    Worried About Running Out of Food in the Last Year: Never true    Ran Out of Food in the Last Year: Never true  Transportation Needs: No Transportation Needs (02/01/2022)   PRAPARE - Administrator, Civil Service (Medical): No    Lack of Transportation (Non-Medical): No  Physical Activity: Insufficiently Active (02/01/2022)   Exercise Vital Sign    Days of Exercise per Week: 7 days    Minutes of Exercise per Session: 20 min  Stress: No Stress Concern Present (02/01/2022)   [employer] of Occupational Health - Occupational Stress Questionnaire    Feeling of Stress : Not at all  Social Connections: Moderately Integrated (02/01/2022)   Social Connection and Isolation Panel [NHANES]    Frequency of Communication with Friends and Family: More than three times a week    Frequency of Social Gatherings with Friends and Family: Twice a week    Attends Religious Services: 1 to 4 times per year    Active Member of [employer] or Organizations: Yes    Attends Banker Meetings: 1 to 4 times per year    Marital Status: Widowed    Tobacco Counseling Counseling given: Not Answered   Clinical Intake:  Pre-visit preparation completed: Yes  Pain : No/denies pain     BMI - recorded: 30.86 Nutritional Status: BMI > 30  Obese Nutritional Risks: None Diabetes: No  How often do you need to have someone help you when you read instructions, pamphlets, or other written materials from your doctor or pharmacy?: 1 - Never  Diabetic? no  Interpreter Needed?: No  Information entered by :: Hibba Schram, LPN   Activities of Daily Living    02/01/2022   10:43 AM  In your present state of health, do you have any difficulty performing the following activities:  Hearing? 1  Vision? 0  Difficulty concentrating or making decisions? 0  Walking or climbing stairs? 0   Dressing or bathing? 0  Doing errands, shopping? 0  Preparing Food and eating ? N  Using the Toilet? N  In the past six months, have you accidently leaked urine? N  Do you have problems with loss of bowel control? N  Managing your Medications? N  Managing your Finances? N  Housekeeping or managing your Housekeeping? N    Patient Care Team: Gwenlyn Fudge, FNP as PCP - General (Family Medicine) Elvis Coil, MD as Consulting Physician (Nephrology)  Indicate any recent Medical Services you may have received from other than [facility] providers in the past year (date may be approximate).     Assessment:   This is a routine wellness examination for [place].  Hearing/Vision screen Hearing Screening - Comments:: C/o mild hearing difficulties   Vision Screening - Comments::  Denies vision difficulties - behind with annual eye exams in [place].  Dietary issues and exercise activities discussed: Current Exercise Habits: Home exercise routine, Type of exercise: walking, Time (Minutes): 20, Frequency (Times/Week): 7, Weekly Exercise (Minutes/Week): 140, Intensity: Mild, Exercise limited by: None identified   Goals Addressed             This Visit's Progress    Patient Stated       Hopes to stay active and independent       Depression Screen    02/01/2022   10:40 AM 01/23/2022    1:27 PM 11/14/2021   12:04 PM 07/26/2021   11:25 AM 01/31/2021   11:34 AM 01/26/2021    4:00 PM 04/13/2020   11:10 AM  PHQ 2/9 Scores  PHQ - 2 Score 0 0 0 0 0 0 0  PHQ- 9 Score 0 3  2 1 1      Fall Risk    02/01/2022   10:35 AM 01/23/2022    1:27 PM 11/14/2021   12:05 PM 07/26/2021   11:25 AM 01/31/2021   11:34 AM  Fall Risk   Falls in the past year? 0 0 0 0 0  Number falls in past yr: 0    0  Injury with Fall? 0    0  Risk for fall due to : No Fall Risks    No Fall Risks  Follow up Falls prevention discussed    Falls prevention discussed    FALL RISK PREVENTION PERTAINING TO THE HOME:  Any stairs  in or around the home? No  If so, are there any without handrails? No  Home free of loose throw rugs in walkways, pet beds, electrical cords, etc? Yes  Adequate lighting in your home to reduce risk of falls? Yes   ASSISTIVE DEVICES UTILIZED TO PREVENT FALLS:  Life alert? No  Use of a cane, walker or w/c? No  Grab bars in the bathroom? No  Shower chair or bench in shower? No  Elevated toilet seat or a handicapped toilet? No   TIMED UP AND GO:  Was the test performed? No . Telephonic visit  Cognitive Function:        02/01/2022   10:42 AM 01/31/2021   11:29 AM  6CIT Screen  What Year? 0 points 0 points  What month? 0 points 0 points  What time? 0 points 0 points  Count back from 20 0 points 0 points  Months in reverse 4 points 2 points  Repeat phrase 4 points 2 points  Total Score 8 points 4 points    Immunizations  There is no immunization history on file for this patient.  TDAP status: Due, Education has been provided regarding the importance of this vaccine. Advised may receive this vaccine at local pharmacy or Health Dept. Aware to provide a copy of the vaccination record if obtained from local pharmacy or Health Dept. Verbalized acceptance and understanding.  Flu Vaccine status: Declined, Education has been provided regarding the importance of this vaccine but patient still declined. Advised may receive this vaccine at local pharmacy or Health Dept. Aware to provide a copy of the vaccination record if obtained from local pharmacy or Health Dept. Verbalized acceptance and understanding.  Pneumococcal vaccine status: Declined,  Education has been provided regarding the importance of this vaccine but patient still declined. Advised may receive this vaccine at local pharmacy or Health Dept. Aware to provide a copy of the vaccination record if obtained from local  pharmacy or Health Dept. Verbalized acceptance and understanding.   Covid-19 vaccine status: Declined, Education  has been provided regarding the importance of this vaccine but patient still declined. Advised may receive this vaccine at local pharmacy or Health Dept.or vaccine clinic. Aware to provide a copy of the vaccination record if obtained from local pharmacy or Health Dept. Verbalized acceptance and understanding.  Qualifies for Shingles Vaccine? Yes   Zostavax completed No   Shingrix Completed?: No.    Education has been provided regarding the importance of this vaccine. Patient has been advised to call insurance company to determine out of pocket expense if they have not yet received this vaccine. Advised may also receive vaccine at local pharmacy or Health Dept. Verbalized acceptance and understanding.  Screening Tests Health Maintenance  Topic Date Due   TETANUS/TDAP  Never done   Zoster Vaccines- Shingrix (1 of 2) 04/25/2022 (Originally 09/24/1959)   Pneumonia Vaccine 36+ Years old (1 - PCV) 07/26/2022 (Originally 09/25/2003)   INFLUENZA VACCINE  02/12/2022   HPV VACCINES  Aged Out   COVID-19 Vaccine  Discontinued    Health Maintenance  Health Maintenance Due  Topic Date Due   TETANUS/TDAP  Never done    Colorectal cancer screening: No longer required.   Lung Cancer Screening: (Low Dose CT Chest recommended if Age 61-80 years, 30 pack-year currently smoking OR have quit w/in 15years.) does not qualify  Additional Screening:  Hepatitis C Screening: does not qualify  Vision Screening: Recommended annual ophthalmology exams for early detection of glaucoma and other disorders of the eye. Is the patient up to date with their annual eye exam?  No  Who is the provider or what is the name of the office in which the patient attends annual eye exams? Optometrist in [place] If pt is not established with a provider, would they like to be referred to a provider to establish care? No .   Dental Screening: Recommended annual dental exams for proper oral hygiene  Community Resource Referral /  Chronic Care Management: CRR required this visit?  No   CCM required this visit?  No      Plan:     I have personally reviewed and noted the following in the patient's chart:   Medical and social history Use of alcohol, tobacco or illicit drugs  Current medications and supplements including opioid prescriptions. Patient is not currently taking opioid prescriptions. Functional ability and status Nutritional status Physical activity Advanced directives List of other physicians Hospitalizations, surgeries, and ER visits in previous 12 months Vitals Screenings to include cognitive, depression, and falls Referrals and appointments  In addition, I have reviewed and discussed with patient certain preventive protocols, quality metrics, and best practice recommendations. A written personalized care plan for preventive services as well as general preventive health recommendations were provided to patient.     [place] Constable, LPN   1/75/1025   Nurse Notes: None

## 2022-02-01 NOTE — Patient Instructions (Signed)
Isaac Beltran , Thank you for taking time to come for your Medicare Wellness Visit. I appreciate your ongoing commitment to your health goals. Please review the following plan we discussed and let me know if I can assist you in the future.   Screening recommendations/referrals: Colonoscopy: no longer required Recommended yearly ophthalmology/optometry visit for glaucoma screening and checkup Recommended yearly dental visit for hygiene and checkup  Vaccinations: declines all Influenza vaccine: recommend every Fall Pneumococcal vaccine: recommend once per lifetime Prevnar-20 Tdap vaccine: recommend every 10 years Shingles vaccine: recommend Shingrix which is 2 doses 2-6 months apart and over 90% effective     Covid-19: recommend 2 doses one month apart with a booster 6 months later   Advanced directives: Advance directive discussed with you today. Even though you declined this today, please call our office should you change your mind, and we can give you the proper paperwork for you to fill out.   Conditions/risks identified: Aim for 30 minutes of exercise or brisk walking, 6-8 glasses of water, and 5 servings of fruits and vegetables each day.   Next appointment: Follow up in one year for your annual wellness visit.   Preventive Care 21 Years and Older, Male  Preventive care refers to lifestyle choices and visits with your health care provider that can promote health and wellness. What does preventive care include? A yearly physical exam. This is also called an annual well check. Dental exams once or twice a year. Routine eye exams. Ask your health care provider how often you should have your eyes checked. Personal lifestyle choices, including: Daily care of your teeth and gums. Regular physical activity. Eating a healthy diet. Avoiding tobacco and drug use. Limiting alcohol use. Practicing safe sex. Taking low doses of aspirin every day. Taking vitamin and mineral supplements as  recommended by your health care provider. What happens during an annual well check? The services and screenings done by your health care provider during your annual well check will depend on your age, overall health, lifestyle risk factors, and family history of disease. Counseling  Your health care provider may ask you questions about your: Alcohol use. Tobacco use. Drug use. Emotional well-being. Home and relationship well-being. Sexual activity. Eating habits. History of falls. Memory and ability to understand (cognition). Work and work Astronomer. Screening  You may have the following tests or measurements: Height, weight, and BMI. Blood pressure. Lipid and cholesterol levels. These may be checked every 5 years, or more frequently if you are over 29 years old. Skin check. Lung cancer screening. You may have this screening every year starting at age 87 if you have a 30-pack-year history of smoking and currently smoke or have quit within the past 15 years. Fecal occult blood test (FOBT) of the stool. You may have this test every year starting at age 6. Flexible sigmoidoscopy or colonoscopy. You may have a sigmoidoscopy every 5 years or a colonoscopy every 10 years starting at age 71. Prostate cancer screening. Recommendations will vary depending on your family history and other risks. Hepatitis C blood test. Hepatitis B blood test. Sexually transmitted disease (STD) testing. Diabetes screening. This is done by checking your blood sugar (glucose) after you have not eaten for a while (fasting). You may have this done every 1-3 years. Abdominal aortic aneurysm (AAA) screening. You may need this if you are a current or former smoker. Osteoporosis. You may be screened starting at age 22 if you are at high risk. Talk with your health  care provider about your test results, treatment options, and if necessary, the need for more tests. Vaccines  Your health care provider may recommend  certain vaccines, such as: Influenza vaccine. This is recommended every year. Tetanus, diphtheria, and acellular pertussis (Tdap, Td) vaccine. You may need a Td booster every 10 years. Zoster vaccine. You may need this after age 5. Pneumococcal 13-valent conjugate (PCV13) vaccine. One dose is recommended after age 54. Pneumococcal polysaccharide (PPSV23) vaccine. One dose is recommended after age 40. Talk to your health care provider about which screenings and vaccines you need and how often you need them. This information is not intended to replace advice given to you by your health care provider. Make sure you discuss any questions you have with your health care provider. Document Released: 07/28/2015 Document Revised: 03/20/2016 Document Reviewed: 05/02/2015 Elsevier Interactive Patient Education  2017 [place] Prevention in the Home Falls can cause injuries. They can happen to people of all ages. There are many things you can do to make your home safe and to help prevent falls. What can I do on the outside of my home? Regularly fix the edges of walkways and driveways and fix any cracks. Remove anything that might make you trip as you walk through a door, such as a raised step or threshold. Trim any bushes or trees on the path to your home. Use bright outdoor lighting. Clear any walking paths of anything that might make someone trip, such as rocks or tools. Regularly check to see if handrails are loose or broken. Make sure that both sides of any steps have handrails. Any raised decks and porches should have guardrails on the edges. Have any leaves, snow, or ice cleared regularly. Use sand or salt on walking paths during winter. Clean up any spills in your garage right away. This includes oil or grease spills. What can I do in the bathroom? Use night lights. Install grab bars by the toilet and in the tub and shower. Do not use towel bars as grab bars. Use non-skid mats or  decals in the tub or shower. If you need to sit down in the shower, use a plastic, non-slip stool. Keep the floor dry. Clean up any water that spills on the floor as soon as it happens. Remove soap buildup in the tub or shower regularly. Attach bath mats securely with double-sided non-slip rug tape. Do not have throw rugs and other things on the floor that can make you trip. What can I do in the bedroom? Use night lights. Make sure that you have a light by your bed that is easy to reach. Do not use any sheets or blankets that are too big for your bed. They should not hang down onto the floor. Have a firm chair that has side arms. You can use this for support while you get dressed. Do not have throw rugs and other things on the floor that can make you trip. What can I do in the kitchen? Clean up any spills right away. Avoid walking on wet floors. Keep items that you use a lot in easy-to-reach places. If you need to reach something above you, use a strong step stool that has a grab bar. Keep electrical cords out of the way. Do not use floor polish or wax that makes floors slippery. If you must use wax, use non-skid floor wax. Do not have throw rugs and other things on the floor that can make you trip. What can  I do with my stairs? Do not leave any items on the stairs. Make sure that there are handrails on both sides of the stairs and use them. Fix handrails that are broken or loose. Make sure that handrails are as long as the stairways. Check any carpeting to make sure that it is firmly attached to the stairs. Fix any carpet that is loose or worn. Avoid having throw rugs at the top or bottom of the stairs. If you do have throw rugs, attach them to the floor with carpet tape. Make sure that you have a light switch at the top of the stairs and the bottom of the stairs. If you do not have them, ask someone to add them for you. What else can I do to help prevent falls? Wear shoes that: Do not  have high heels. Have rubber bottoms. Are comfortable and fit you well. Are closed at the toe. Do not wear sandals. If you use a stepladder: Make sure that it is fully opened. Do not climb a closed stepladder. Make sure that both sides of the stepladder are locked into place. Ask someone to hold it for you, if possible. Clearly mark and make sure that you can see: Any grab bars or handrails. First and last steps. Where the edge of each step is. Use tools that help you move around (mobility aids) if they are needed. These include: Canes. Walkers. Scooters. Crutches. Turn on the lights when you go into a dark area. Replace any light bulbs as soon as they burn out. Set up your furniture so you have a clear path. Avoid moving your furniture around. If any of your floors are uneven, fix them. If there are any pets around you, be aware of where they are. Review your medicines with your doctor. Some medicines can make you feel dizzy. This can increase your chance of falling. Ask your doctor what other things that you can do to help prevent falls. This information is not intended to replace advice given to you by your health care provider. Make sure you discuss any questions you have with your health care provider. Document Released: 04/27/2009 Document Revised: 12/07/2015 Document Reviewed: 08/05/2014 Elsevier Interactive Patient Education  2017 [employer].

## 2022-04-25 ENCOUNTER — Ambulatory Visit: Payer: Medicare Other | Admitting: Family Medicine

## 2022-04-29 ENCOUNTER — Other Ambulatory Visit: Payer: Self-pay | Admitting: Family Medicine

## 2022-04-29 DIAGNOSIS — N529 Male erectile dysfunction, unspecified: Secondary | ICD-10-CM

## 2022-04-29 NOTE — Telephone Encounter (Signed)
Last office visit 01/23/22 Last refill 04/03/21, #30, 2 refills

## 2022-04-30 DIAGNOSIS — N1832 Chronic kidney disease, stage 3b: Secondary | ICD-10-CM | POA: Diagnosis not present

## 2022-04-30 DIAGNOSIS — I129 Hypertensive chronic kidney disease with stage 1 through stage 4 chronic kidney disease, or unspecified chronic kidney disease: Secondary | ICD-10-CM | POA: Diagnosis not present

## 2022-04-30 DIAGNOSIS — K219 Gastro-esophageal reflux disease without esophagitis: Secondary | ICD-10-CM | POA: Diagnosis not present

## 2022-07-26 ENCOUNTER — Ambulatory Visit (INDEPENDENT_AMBULATORY_CARE_PROVIDER_SITE_OTHER): Payer: Medicare Other | Admitting: Family Medicine

## 2022-07-26 ENCOUNTER — Encounter: Payer: Self-pay | Admitting: Family Medicine

## 2022-07-26 ENCOUNTER — Other Ambulatory Visit (INDEPENDENT_AMBULATORY_CARE_PROVIDER_SITE_OTHER): Payer: Medicare Other

## 2022-07-26 ENCOUNTER — Ambulatory Visit (INDEPENDENT_AMBULATORY_CARE_PROVIDER_SITE_OTHER): Payer: Medicare Other

## 2022-07-26 VITALS — BP 138/68 | HR 85 | Temp 97.8°F | Ht 69.0 in | Wt 211.1 lb

## 2022-07-26 DIAGNOSIS — F5104 Psychophysiologic insomnia: Secondary | ICD-10-CM

## 2022-07-26 DIAGNOSIS — I491 Atrial premature depolarization: Secondary | ICD-10-CM

## 2022-07-26 DIAGNOSIS — I10 Essential (primary) hypertension: Secondary | ICD-10-CM

## 2022-07-26 DIAGNOSIS — R42 Dizziness and giddiness: Secondary | ICD-10-CM

## 2022-07-26 DIAGNOSIS — R0600 Dyspnea, unspecified: Secondary | ICD-10-CM | POA: Diagnosis not present

## 2022-07-26 DIAGNOSIS — R0609 Other forms of dyspnea: Secondary | ICD-10-CM

## 2022-07-26 DIAGNOSIS — K219 Gastro-esophageal reflux disease without esophagitis: Secondary | ICD-10-CM

## 2022-07-26 DIAGNOSIS — R61 Generalized hyperhidrosis: Secondary | ICD-10-CM | POA: Diagnosis not present

## 2022-07-26 DIAGNOSIS — N1832 Chronic kidney disease, stage 3b: Secondary | ICD-10-CM | POA: Diagnosis not present

## 2022-07-26 DIAGNOSIS — E782 Mixed hyperlipidemia: Secondary | ICD-10-CM

## 2022-07-26 NOTE — Progress Notes (Signed)
Established Patient Office Visit  Subjective   Patient ID: Isaac Beltran, male    DOB: [birth year]  Age: 82 y.o. MRN: [id number]  Chief Complaint  Patient presents with   Medical Management of Chronic Issues   Hypertension   Gastroesophageal Reflux    HPI HTN Complaint with meds - Yes Current Medications - chlorthalidone 25 mg daily  He reports dizziness for the last few weeks. It occurs intermittently and worsens with activity. He feels light headed. He also reports sweating, fatigue, and shortness of breath with these episodes. Denies palpitations. Denies chest pain, edema, focal weakness, numbness, tingling, or orthopnea. He does report some blurred vision at times. Rest improves symptoms but does not resolve symptoms.   2. HLD Last LDL was <100. Not currently on medications  3. GERD Compliant with medications - Yes Current medications - prilosec 20 mg Sore throat - No Voice change - No Hemoptysis - No Dysphagia or dyspepsia - No Water brash - No Red Flags (weight loss, hematochezia, melena, weight loss, early satiety, fevers, odynophagia, or persistent vomiting) - No  Hx of esophogeal strictures.   4. Insomnia Tylenol prn has been working fairly well. He is no longer taking lunesta.    Past Medical History:  Diagnosis Date   Anemia    CKD (chronic kidney disease)    GERD (gastroesophageal reflux disease)    Hyperlipidemia    Hyperparathyroidism (HCC)    Hypertension    Lumbar spinal stenosis    Psoriasis    Vitamin D deficiency       ROS As per HPI.    Objective:     BP 138/68   Pulse 85   Temp 97.8 F (36.6 C) (Oral)   Ht 5\' 9"  (1.753 m)   Wt 211 lb 2 oz (95.8 kg)   SpO2 95%   BMI 31.18 kg/m  BP Readings from Last 3 Encounters:  07/26/22 138/68  01/23/22 (!) 143/72  11/14/21 (!) 146/77      Physical Exam Vitals and nursing note reviewed.  Constitutional:      General: He is not in acute distress.    Appearance: He is not  ill-appearing, toxic-appearing or diaphoretic.  Eyes:     General:        Right eye: No discharge.        Left eye: No discharge.     Extraocular Movements: Extraocular movements intact.     Pupils: Pupils are equal, round, and reactive to light.  Neck:     Vascular: No carotid bruit.  Cardiovascular:     Rate and Rhythm: Normal rate and regular rhythm.     Heart sounds: Normal heart sounds. No murmur heard. Pulmonary:     Effort: Pulmonary effort is normal. No respiratory distress.     Breath sounds: Normal breath sounds.  Abdominal:     General: Bowel sounds are normal. There is no distension.     Palpations: Abdomen is soft.     Tenderness: There is no abdominal tenderness. There is no guarding or rebound.  Musculoskeletal:     Cervical back: Neck supple. No rigidity.     Right lower leg: No edema.     Left lower leg: No edema.  Skin:    General: Skin is warm and dry.  Neurological:     General: No focal deficit present.     Mental Status: He is alert and oriented to person, place, and time.     Cranial Nerves: No  cranial nerve deficit.     Motor: No weakness.     Coordination: Coordination normal.     Gait: Gait normal.  Psychiatric:        Mood and Affect: Mood normal.        Behavior: Behavior normal.      No results found for any visits on 07/26/22.    The ASCVD Risk score (Arnett DK, et al., 2019) failed to calculate for the following reasons:   The 2019 ASCVD risk score is only valid for ages 75 to 69    Assessment & Plan:   Isaac Beltran was seen today for medical management of chronic issues, hypertension and gastroesophageal reflux.  Diagnoses and all orders for this visit:  Dizziness Dyspnea on exertion Diaphoresis PAC (premature atrial contraction) Patient was walked without desaturations. EKG with frequent PACs today, ?LVH on EKG. Normal CXR today. No murmur on exam. Zio placed today. Referral to cardiology. Discussed return precautions and when to week  emergency care.  -     EKG 12-Lead -     CBC with Differential/Platelet -     CMP14+EGFR -     TSH -     Ambulatory referral to Cardiology -     LONG TERM MONITOR (3-14 DAYS); Future -     DG Chest 2 View; Future  Primary hypertension BP well controlled. Continue chlorthalidone.  -     CBC with Differential/Platelet -     CMP14+EGFR -     TSH -     Ambulatory referral to Cardiology -     LONG TERM MONITOR (3-14 DAYS); Future  Stage 3b chronic kidney disease ([place]) Labs pending.   Mixed hyperlipidemia Last LDL was <100. Diet and exercise.   Gastroesophageal reflux disease without esophagitis Well controlled on current regimen. Continue protonix.   Chronic insomnia Well controlled with tylenol PM.  Return in about 3 months (around 10/25/2022) for chronic follow up.   The patient indicates understanding of these issues and agrees with the plan.  Gwenlyn Perking, FNP

## 2022-07-26 NOTE — Patient Instructions (Signed)
Dizziness Dizziness is a common problem. It is a feeling of unsteadiness or light-headedness. You may feel like you are about to faint. Dizziness can lead to injury if you stumble or fall. Anyone can become dizzy, but dizziness is more common in older adults. This condition can be caused by a number of things, including medicines, dehydration, or illness. Follow these instructions at home: Eating and drinking  Drink enough fluid to keep your urine pale yellow. This helps to keep you from becoming dehydrated. Try to drink more clear fluids, such as water. Do not drink alcohol. Limit your caffeine intake if told to do so by your health care provider. Check ingredients and nutrition facts to see if a food or beverage contains caffeine. Limit your salt (sodium) intake if told to do so by your health care provider. Check ingredients and nutrition facts to see if a food or beverage contains sodium. Activity  Avoid making quick movements. Rise slowly from chairs and steady yourself until you feel okay. In the morning, first sit up on the side of the bed. When you feel okay, stand slowly while you hold onto something until you know that your balance is good. If you need to stand in one place for a long time, move your legs often. Tighten and relax the muscles in your legs while you are standing. Do not drive or use machinery if you feel dizzy. Avoid bending down if you feel dizzy. Place items in your home so that they are easy for you to reach without leaning over. Lifestyle Do not use any products that contain nicotine or tobacco. These products include cigarettes, chewing tobacco, and vaping devices, such as e-cigarettes. If you need help quitting, ask your health care provider. Try to reduce your stress level by using methods such as yoga or meditation. Talk with your health care provider if you need help to manage your stress. General instructions Watch your dizziness for any changes. Take  over-the-counter and prescription medicines only as told by your health care provider. Talk with your health care provider if you think that your dizziness is caused by a medicine that you are taking. Tell a friend or a family member that you are feeling dizzy. If he or she notices any changes in your behavior, have this person call your health care provider. Keep all follow-up visits. This is important. Contact a health care provider if: Your dizziness does not go away or you have new symptoms. Your dizziness or light-headedness gets worse. You feel nauseous. You have reduced hearing. You have a fever. You have neck pain or a stiff neck. Your dizziness leads to an injury or a fall. Get help right away if: You vomit or have diarrhea and are unable to eat or drink anything. You have problems talking, walking, swallowing, or using your arms, hands, or legs. You feel generally weak. You have any bleeding. You are not thinking clearly or you have trouble forming sentences. It may take a friend or family member to notice this. You have chest pain, abdominal pain, shortness of breath, or sweating. Your vision changes or you develop a severe headache. These symptoms may represent a serious problem that is an emergency. Do not wait to see if the symptoms will go away. Get medical help right away. Call your local emergency services (911 in the [place] Do not drive yourself to the hospital. Summary Dizziness is a feeling of unsteadiness or light-headedness. This condition can be caused by a number of   things, including medicines, dehydration, or illness. Anyone can become dizzy, but dizziness is more common in older adults. Drink enough fluid to keep your urine pale yellow. Do not drink alcohol. Avoid making quick movements if you feel dizzy. Monitor your dizziness for any changes. This information is not intended to replace advice given to you by your health care provider. Make sure you discuss any  questions you have with your health care provider. Document Revised: 06/05/2020 Document Reviewed: 06/05/2020 Elsevier Patient Education  2023 Elsevier Inc.  

## 2022-07-27 LAB — CMP14+EGFR
ALT: 14 IU/L (ref 0–44)
AST: 18 IU/L (ref 0–40)
Albumin/Globulin Ratio: 1.8 (ref 1.2–2.2)
Albumin: 4.4 g/dL (ref 3.7–4.7)
Alkaline Phosphatase: 59 IU/L (ref 44–121)
BUN/Creatinine Ratio: 15 (ref 10–24)
BUN: 32 mg/dL — ABNORMAL HIGH (ref 8–27)
Bilirubin Total: 0.6 mg/dL (ref 0.0–1.2)
CO2: 21 mmol/L (ref 20–29)
Calcium: 9.6 mg/dL (ref 8.6–10.2)
Chloride: 102 mmol/L (ref 96–106)
Creatinine, Ser: 2.08 mg/dL — ABNORMAL HIGH (ref 0.76–1.27)
Globulin, Total: 2.4 g/dL (ref 1.5–4.5)
Glucose: 96 mg/dL (ref 70–99)
Potassium: 4.5 mmol/L (ref 3.5–5.2)
Sodium: 139 mmol/L (ref 134–144)
Total Protein: 6.8 g/dL (ref 6.0–8.5)
eGFR: 31 mL/min/{1.73_m2} — ABNORMAL LOW (ref >59)

## 2022-07-27 LAB — CBC WITH DIFFERENTIAL/PLATELET
Basophils Absolute: 0.1 10*3/uL (ref 0.0–0.2)
Basos: 1 %
EOS (ABSOLUTE): 0.3 10*3/uL (ref 0.0–0.4)
Eos: 4 %
Hematocrit: 36.2 % — ABNORMAL LOW (ref 37.5–51.0)
Hemoglobin: 12.1 g/dL — ABNORMAL LOW (ref 13.0–17.7)
Immature Grans (Abs): 0 10*3/uL (ref 0.0–0.1)
Immature Granulocytes: 0 %
Lymphocytes Absolute: 2.6 10*3/uL (ref 0.7–3.1)
Lymphs: 34 %
MCH: 29.2 pg (ref 26.6–33.0)
MCHC: 33.4 g/dL (ref 31.5–35.7)
MCV: 87 fL (ref 79–97)
Monocytes Absolute: 0.6 10*3/uL (ref 0.1–0.9)
Monocytes: 8 %
Neutrophils Absolute: 4.2 10*3/uL (ref 1.4–7.0)
Neutrophils: 53 %
Platelets: 307 10*3/uL (ref 150–450)
RBC: 4.15 x10E6/uL (ref 4.14–5.80)
RDW: 12.9 % (ref 11.6–15.4)
WBC: 7.8 10*3/uL (ref 3.4–10.8)

## 2022-07-27 LAB — TSH: TSH: 0.738 u[IU]/mL (ref 0.450–4.500)

## 2022-07-31 ENCOUNTER — Other Ambulatory Visit: Payer: Self-pay | Admitting: Family Medicine

## 2022-08-01 ENCOUNTER — Other Ambulatory Visit: Payer: Self-pay | Admitting: *Deleted

## 2022-08-01 DIAGNOSIS — K219 Gastro-esophageal reflux disease without esophagitis: Secondary | ICD-10-CM

## 2022-08-01 MED ORDER — CHLORTHALIDONE 25 MG PO TABS: 25.0000 mg | ORAL_TABLET | Freq: Every day | ORAL | 1 refills | Status: DC

## 2022-08-01 MED ORDER — OMEPRAZOLE 20 MG PO CPDR: 20.0000 mg | DELAYED_RELEASE_CAPSULE | Freq: Every day | ORAL | 1 refills | Status: DC

## 2022-08-01 NOTE — Telephone Encounter (Signed)
Pt came in today about RFs on his Omeprazole & Chlorthalidone, that we had refused them because they were not on his list. In looking at his chart, we have not had any refill requests come in, these are historical meds, will send requests to his PCP

## 2022-08-01 NOTE — Telephone Encounter (Signed)
Pt aware refills sent to pharmacy 

## 2022-08-15 ENCOUNTER — Encounter: Payer: Self-pay | Admitting: Internal Medicine

## 2022-08-15 ENCOUNTER — Ambulatory Visit: Payer: Medicare Other | Attending: Internal Medicine | Admitting: Internal Medicine

## 2022-08-15 VITALS — BP 173/93 | HR 92 | Ht 71.0 in | Wt 208.6 lb

## 2022-08-15 DIAGNOSIS — I471 Supraventricular tachycardia, unspecified: Secondary | ICD-10-CM | POA: Diagnosis not present

## 2022-08-15 DIAGNOSIS — R42 Dizziness and giddiness: Secondary | ICD-10-CM | POA: Diagnosis not present

## 2022-08-15 DIAGNOSIS — R0609 Other forms of dyspnea: Secondary | ICD-10-CM | POA: Diagnosis not present

## 2022-08-15 DIAGNOSIS — I10 Essential (primary) hypertension: Secondary | ICD-10-CM | POA: Diagnosis not present

## 2022-08-15 DIAGNOSIS — E782 Mixed hyperlipidemia: Secondary | ICD-10-CM | POA: Diagnosis not present

## 2022-08-15 DIAGNOSIS — K219 Gastro-esophageal reflux disease without esophagitis: Secondary | ICD-10-CM | POA: Diagnosis not present

## 2022-08-15 NOTE — Patient Instructions (Signed)
Medication Instructions:  No Changes In Medications at this time.  *If you need a refill on your cardiac medications before your next appointment, please call your pharmacy*  Testing/Procedures: Your physician has requested that you have an echocardiogram. Echocardiography is a painless test that uses sound waves to create images of your heart. It provides your doctor with information about the size and shape of your heart and how well your heart's chambers and valves are working. You may receive an ultrasound enhancing agent through an IV if needed to better visualize your heart during the echo.This procedure takes approximately one hour. There are no restrictions for this procedure. This will take place at the [address], [address].   Follow-Up: At [facility], you and your health needs are our priority.  As part of our continuing mission to provide you with exceptional heart care, we have created designated Provider Care Teams.  These Care Teams include your primary Cardiologist (physician) and Advanced Practice Providers (APPs -  Physician Assistants and Nurse Practitioners) who all work together to provide you with the care you need, when you need it.  Your next appointment:   AS NEEDED   Provider:   Dr. Margaretann Loveless

## 2022-08-15 NOTE — Progress Notes (Signed)
Cardiology Office Note:    Date: 08/15/2022  ID:  Isaac Beltran, DOB 1941/01/20, MRN [id number]  PCP:  Gwenlyn Perking, FNP  Cardiologist:  None  Electrophysiologist:  None   Referring MD: Gwenlyn Perking, FNP   Chief Complaint/Reason for Referral: PACs, dizziness  History of Present Illness:    Isaac Beltran is a 82 y.o. male with a history of hypertension, hyperlipidemia, GERD, chronic kidney disease, hyperparathyroidism, and insomnia, who presents today for premature atrial contractions and dizziness.  At his visit with Marjorie Smolder, FNP on 07/26/2022 he reported intermittent dizziness which worsened with activity. He reported sweating, fatigue, and SOB during these episodes. He denied any palpitations or chest pain during the episodes. Rest improved his symptoms, but did not resolve them. His EKG showed frequent PAC, LVH. Normal report of CXR.  Today, the patent states that he has been having issues when getting up in the morning such as orthostatic lightheadedness with SOB and back pain. He had to sit back down during these episodes. He thought he was just dehydrated and tried to drink a lot more water. This resolved the issue prior to this consultation.   He notes that he usually doesn't have issues with blood pressure and is confused as to why his pressure in clinic is 173/93, on recheck it was grossly normal. His blood pressure at his visit with Marjorie Smolder FNP was normal.  Monitor placed -  but was already hydrating better. He had less dizziness or SOB. His SVT episodes on monitor were asymptomatic.  He smoked until he was 28 and has quit since. He tries to stay active.  He denies any chest pain, shortness of breath, or peripheral edema. No headaches, syncope, orthopnea, or PND.  Past Medical History:  Diagnosis Date   Anemia    CKD (chronic kidney disease)    GERD (gastroesophageal reflux disease)    Hyperlipidemia    Hyperparathyroidism ([place])    Hypertension     Lumbar spinal stenosis    Psoriasis    Vitamin D deficiency     No past surgical history on file.  Current Medications: Current Meds  Medication Sig   acetaminophen (TYLENOL) 500 MG tablet Take 500 mg by mouth every 6 (six) hours as needed.   betamethasone dipropionate 0.05 % cream Apply topically 2 (two) times daily.   chlorthalidone (HYGROTON) 25 MG tablet Take 1 tablet (25 mg total) by mouth daily.   Cholecalciferol (VITAMIN D3) 25 MCG (1000 UT) CAPS Take by mouth.   diphenhydramine-acetaminophen (TYLENOL PM) 25-500 MG TABS tablet Take 1 tablet by mouth at bedtime as needed.   omeprazole (PRILOSEC) 20 MG capsule Take 1 capsule (20 mg total) by mouth daily.     Allergies:   Simvastatin   Social History   Tobacco Use   Smoking status: Former    Types: Cigarettes    Quit date: 04/13/1985    Years since quitting: 37.4   Smokeless tobacco: Never  Vaping Use   Vaping Use: Never used  Substance Use Topics   Alcohol use: Yes    Alcohol/week: 1.0 standard drink of alcohol    Types: 1 Cans of beer per week    Comment: occ   Drug use: Never     Family History: The patient's family history includes Alzheimer's disease in his mother; Heart attack in his father.  ROS:   Please see the history of present illness.    (+) SOB (+) Dizziness (+) Lightheadedness All other  systems reviewed and are negative.  EKGs/Labs/Other Studies Reviewed:    The following studies were reviewed today:  EKG:  The EKG is personally reviewed 08/15/2022: NSR, PAC, nonspecific ST T wave abnormality.  Imaging studies that I have independently reviewed today: n/a  Recent Labs: 07/26/2022: ALT 14; BUN 32; Creatinine, Ser 2.08; Hemoglobin 12.1; Platelets 307; Potassium 4.5; Sodium 139; TSH 0.738  Recent Lipid Panel    Component Value Date/Time   CHOL 174 07/26/2021 1216   TRIG 155 (H) 07/26/2021 1216   HDL 44 07/26/2021 1216   CHOLHDL 4.0 07/26/2021 1216   LDLCALC 103 (H) 07/26/2021 1216     Physical Exam:    VS:  BP (!) 173/93   Pulse 92   Ht '5\' 11"'$  (1.803 m)   Wt 208 lb 9.6 oz (94.6 kg)   SpO2 98%   BMI 29.09 kg/m     Wt Readings from Last 5 Encounters:  08/15/22 208 lb 9.6 oz (94.6 kg)  07/26/22 211 lb 2 oz (95.8 kg)  02/01/22 209 lb (94.8 kg)  01/23/22 209 lb 3.2 oz (94.9 kg)  11/14/21 209 lb (94.8 kg)    Constitutional: No acute distress Eyes: sclera non-icteric, normal conjunctiva and lids ENMT: normal dentition, moist mucous membranes Cardiovascular: regular rhythm, normal rate, no murmur. S1 and S2 normal. No jugular venous distention.  Respiratory: clear to auscultation bilaterally GI : normal bowel sounds, soft and nontender. No distention.   MSK: extremities warm, well perfused. No edema.  NEURO: grossly nonfocal exam, moves all extremities. PSYCH: alert and oriented x 3, normal mood and affect.   ASSESSMENT:    1. SVT (supraventricular tachycardia)   2. Essential hypertension   3. Gastroesophageal reflux disease without esophagitis   4. Mixed hyperlipidemia   5. Dizziness    PLAN:    SVT (supraventricular tachycardia) - Plan: EKG 12-Lead, ECHOCARDIOGRAM COMPLETE  Essential hypertension  Gastroesophageal reflux disease without esophagitis  Mixed hyperlipidemia  Dizziness  - feels like his dizziness has resolved with hydration. Suspect PACs and SVT are better as well. Ok to observe. - will obtain echo to ensure no structural heart disease as contributor. Pt in agreement will schedule. -follow up can be PRN if testing normal   Total time of encounter: 30 minutes total time of encounter, including 25 minutes spent in face-to-face patient care on the date of this encounter. This time includes coordination of care and counseling regarding above mentioned problem list. Remainder of non-face-to-face time involved reviewing chart documents/testing relevant to the patient encounter and documentation in the medical record. I have independently  reviewed documentation from referring provider.   Cherlynn Kaiser, MD, [place]   Shared Decision Making/Informed Consent:       Medication Adjustments/Labs and Tests Ordered: Current medicines are reviewed at length with the patient today.  Concerns regarding medicines are outlined above.   Orders Placed This Encounter  Procedures   EKG 12-Lead   ECHOCARDIOGRAM COMPLETE   No orders of the defined types were placed in this encounter.  Patient Instructions  Medication Instructions:  No Changes In Medications at this time.  *If you need a refill on your cardiac medications before your next appointment, please call your pharmacy*  Testing/Procedures: Your physician has requested that you have an echocardiogram. Echocardiography is a painless test that uses sound waves to create images of your heart. It provides your doctor with information about the size and shape of your heart and how well your heart's  chambers and valves are working. You may receive an ultrasound enhancing agent through an IV if needed to better visualize your heart during the echo.This procedure takes approximately one hour. There are no restrictions for this procedure. This will take place at the [address].   Follow-Up: At [facility], you and your health needs are our priority.  As part of our continuing mission to provide you with exceptional heart care, we have created designated Provider Care Teams.  These Care Teams include your primary Cardiologist (physician) and Advanced Practice Providers (APPs -  Physician Assistants and Nurse Practitioners) who all work together to provide you with the care you need, when you need it.  Your next appointment:   AS NEEDED   Provider:   Dr. Margaretann Loveless   I,Coren O'Brien,acting as a scribe for Elouise Munroe, MD.,have documented all relevant documentation on the behalf of Elouise Munroe, MD,as directed by  Elouise Munroe, MD while in the presence of Elouise Munroe, MD.  I, Elouise Munroe, MD, have reviewed all documentation for the visit on 08/15/2022. The documentation on today's date of service for the exam, diagnosis, procedures, and orders are all accurate and complete.

## 2022-09-05 ENCOUNTER — Ambulatory Visit (HOSPITAL_COMMUNITY): Payer: Medicare Other | Attending: Cardiology

## 2022-09-05 DIAGNOSIS — I10 Essential (primary) hypertension: Secondary | ICD-10-CM | POA: Diagnosis not present

## 2022-09-05 DIAGNOSIS — E785 Hyperlipidemia, unspecified: Secondary | ICD-10-CM | POA: Diagnosis not present

## 2022-09-05 DIAGNOSIS — I471 Supraventricular tachycardia, unspecified: Secondary | ICD-10-CM | POA: Insufficient documentation

## 2022-09-05 LAB — ECHOCARDIOGRAM COMPLETE
Area-P 1/2: 4.7 cm2
S' Lateral: 2.7 cm

## 2022-10-24 ENCOUNTER — Ambulatory Visit (INDEPENDENT_AMBULATORY_CARE_PROVIDER_SITE_OTHER): Payer: Medicare Other | Admitting: Family Medicine

## 2022-10-24 ENCOUNTER — Encounter: Payer: Self-pay | Admitting: Family Medicine

## 2022-10-24 VITALS — BP 147/64 | HR 70 | Temp 98.2°F | Ht 71.0 in | Wt 207.1 lb

## 2022-10-24 DIAGNOSIS — E782 Mixed hyperlipidemia: Secondary | ICD-10-CM | POA: Diagnosis not present

## 2022-10-24 DIAGNOSIS — K219 Gastro-esophageal reflux disease without esophagitis: Secondary | ICD-10-CM | POA: Diagnosis not present

## 2022-10-24 DIAGNOSIS — I10 Essential (primary) hypertension: Secondary | ICD-10-CM

## 2022-10-24 DIAGNOSIS — N1832 Chronic kidney disease, stage 3b: Secondary | ICD-10-CM

## 2022-10-24 NOTE — Progress Notes (Signed)
Established Patient Office Visit  Subjective   Patient ID: Isaac Beltran, male    DOB: 1940-11-25  Age: 82 y.o. MRN: [id number]  Chief Complaint  Patient presents with   Medical Management of Chronic Issues   Hypertension    HPI HTN Complaint with meds - Yes Current Medications - chlorthalidone 25 mg Checking BP at home - no Pertinent ROS:  Headache - No Fatigue - No Visual Disturbances - No Chest pain - No Dyspnea - No Palpitations - No LE edema - No  Recently had unremarkable echo with cardio.   2. HLD Last LDL was 103. Not currently on medications. He is not fasting today.  3. GERD Compliant with medications - Yes Current medications - prilosec 20 mg Sore throat - No Voice change - No Hemoptysis - No Dysphagia or dyspepsia - No Water brash - No Red Flags (weight loss, hematochezia, melena, weight loss, early satiety, fevers, odynophagia, or persistent vomiting) - No  Hx of esophogeal strictures.       Past Medical History:  Diagnosis Date   Anemia    CKD (chronic kidney disease)    GERD (gastroesophageal reflux disease)    Hyperlipidemia    Hyperparathyroidism    Hypertension    Lumbar spinal stenosis    Psoriasis    Vitamin D deficiency       ROS As per HPI.    Objective:     BP (!) 147/64   Pulse 70   Temp 98.2 F (36.8 C) (Temporal)   Ht 5\' 11"  (1.803 m)   Wt 207 lb 2 oz (94 kg)   SpO2 96%   BMI 28.89 kg/m  BP Readings from Last 3 Encounters:  10/24/22 (!) 147/64  08/15/22 (!) 173/93  07/26/22 138/68      Physical Exam Vitals and nursing note reviewed.  Constitutional:      General: He is not in acute distress.    Appearance: He is not ill-appearing, toxic-appearing or diaphoretic.  Eyes:     General:        Right eye: No discharge.        Left eye: No discharge.     Extraocular Movements: Extraocular movements intact.     Pupils: Pupils are equal, round, and reactive to light.  Neck:     Vascular: No carotid bruit.   Cardiovascular:     Rate and Rhythm: Normal rate and regular rhythm.     Heart sounds: Normal heart sounds. No murmur heard. Pulmonary:     Effort: Pulmonary effort is normal. No respiratory distress.     Breath sounds: Normal breath sounds.  Abdominal:     General: Bowel sounds are normal. There is no distension.     Palpations: Abdomen is soft.     Tenderness: There is no abdominal tenderness. There is no guarding or rebound.  Musculoskeletal:     Cervical back: Neck supple. No rigidity.     Right lower leg: No edema.     Left lower leg: No edema.  Skin:    General: Skin is warm and dry.  Neurological:     General: No focal deficit present.     Mental Status: He is alert and oriented to person, place, and time.     Cranial Nerves: No cranial nerve deficit.     Motor: No weakness.     Coordination: Coordination normal.     Gait: Gait normal.  Psychiatric:        Mood  and Affect: Mood normal.        Behavior: Behavior normal.      No results found for any visits on 10/24/22.    The ASCVD Risk score (Arnett DK, et al., 2019) failed to calculate for the following reasons:   The 2019 ASCVD risk score is only valid for ages 15 to 51    Assessment & Plan:   Isaac Beltran was seen today for medical management of chronic issues and hypertension.  Diagnoses and all orders for this visit:  Primary hypertension Diet and exercise. Well controlled for age. Labs pending.  -     BMP8+EGFR -     CBC with Differential/Platelet  Mixed hyperlipidemia Will check fasting panel at next visit.   Stage 3b chronic kidney disease Labs pending. -     BMP8+EGFR -     CBC with Differential/Platelet -     Vitamin D, 25-hydroxy  Gastroesophageal reflux disease without esophagitis Hx of esophageal structure. Well controlled on current regimen.   Return in about 6 months (around 04/25/2023) for CPE.   The patient indicates understanding of these issues and agrees with the plan.  Gabriel Earing, FNP

## 2022-10-25 ENCOUNTER — Other Ambulatory Visit: Payer: Self-pay | Admitting: Family Medicine

## 2022-10-25 DIAGNOSIS — D631 Anemia in chronic kidney disease: Secondary | ICD-10-CM

## 2022-10-25 LAB — CBC WITH DIFFERENTIAL/PLATELET
Basophils Absolute: 0.1 10*3/uL (ref 0.0–0.2)
Basos: 2 %
EOS (ABSOLUTE): 0.5 10*3/uL — ABNORMAL HIGH (ref 0.0–0.4)
Eos: 8 %
Hematocrit: 33 % — ABNORMAL LOW (ref 37.5–51.0)
Hemoglobin: 10.9 g/dL — ABNORMAL LOW (ref 13.0–17.7)
Immature Grans (Abs): 0 10*3/uL (ref 0.0–0.1)
Immature Granulocytes: 0 %
Lymphocytes Absolute: 1.9 10*3/uL (ref 0.7–3.1)
Lymphs: 34 %
MCH: 29 pg (ref 26.6–33.0)
MCHC: 33 g/dL (ref 31.5–35.7)
MCV: 88 fL (ref 79–97)
Monocytes Absolute: 0.5 10*3/uL (ref 0.1–0.9)
Monocytes: 9 %
Neutrophils Absolute: 2.7 10*3/uL (ref 1.4–7.0)
Neutrophils: 47 %
Platelets: 297 10*3/uL (ref 150–450)
RBC: 3.76 x10E6/uL — ABNORMAL LOW (ref 4.14–5.80)
RDW: 14 % (ref 11.6–15.4)
WBC: 5.8 10*3/uL (ref 3.4–10.8)

## 2022-10-25 LAB — BMP8+EGFR
BUN/Creatinine Ratio: 16 (ref 10–24)
BUN: 28 mg/dL — ABNORMAL HIGH (ref 8–27)
CO2: 21 mmol/L (ref 20–29)
Calcium: 9.2 mg/dL (ref 8.6–10.2)
Chloride: 106 mmol/L (ref 96–106)
Creatinine, Ser: 1.8 mg/dL — ABNORMAL HIGH (ref 0.76–1.27)
Glucose: 99 mg/dL (ref 70–99)
Potassium: 5 mmol/L (ref 3.5–5.2)
Sodium: 142 mmol/L (ref 134–144)
eGFR: 37 mL/min/{1.73_m2} — ABNORMAL LOW (ref >59)

## 2022-10-25 LAB — VITAMIN D 25 HYDROXY (VIT D DEFICIENCY, FRACTURES): Vit D, 25-Hydroxy: 47.3 ng/mL (ref 30.0–100.0)

## 2022-10-25 MED ORDER — FERROUS SULFATE 325 (65 FE) MG PO TBEC: 325.0000 mg | DELAYED_RELEASE_TABLET | Freq: Every day | ORAL | 3 refills | Status: DC

## 2022-10-28 ENCOUNTER — Ambulatory Visit: Payer: Medicare Other | Admitting: Family Medicine

## 2022-11-29 ENCOUNTER — Other Ambulatory Visit: Payer: Self-pay | Admitting: Family Medicine

## 2022-11-29 DIAGNOSIS — N529 Male erectile dysfunction, unspecified: Secondary | ICD-10-CM

## 2022-11-30 ENCOUNTER — Other Ambulatory Visit: Payer: Self-pay | Admitting: Family Medicine

## 2022-11-30 DIAGNOSIS — N529 Male erectile dysfunction, unspecified: Secondary | ICD-10-CM

## 2023-01-30 ENCOUNTER — Other Ambulatory Visit: Payer: Self-pay | Admitting: Family Medicine

## 2023-01-30 DIAGNOSIS — K219 Gastro-esophageal reflux disease without esophagitis: Secondary | ICD-10-CM

## 2023-02-02 ENCOUNTER — Other Ambulatory Visit: Payer: Self-pay | Admitting: Family Medicine

## 2023-02-02 DIAGNOSIS — N529 Male erectile dysfunction, unspecified: Secondary | ICD-10-CM

## 2023-02-05 ENCOUNTER — Ambulatory Visit: Payer: Medicare Other

## 2023-02-05 VITALS — Ht 71.0 in | Wt 209.0 lb

## 2023-02-05 DIAGNOSIS — Z0000 Encounter for general adult medical examination without abnormal findings: Secondary | ICD-10-CM

## 2023-02-05 NOTE — Progress Notes (Signed)
Subjective:   Isaac Beltran is a 82 y.o. male who presents for Medicare Annual/Subsequent preventive examination.  Visit Complete: Virtual  I connected with  [name] on 02/05/23 by a audio enabled telemedicine application and verified that I am speaking with the correct person using two identifiers.  Patient Location: Home  Provider Location: Home Office  I discussed the limitations of evaluation and management by telemedicine. The patient expressed understanding and agreed to proceed.  Patient Medicare AWV questionnaire was completed by the patient on 02/05/2023; I have confirmed that all information answered by patient is correct and no changes since this date.  Review of Systems    Per patient no change in vitals since last visit; unable to obtain new vitals due to this being a telehealth visit.  Patient was unable to self-report vital signs via telehealth due to a lack of equipment at home.  Cardiac Risk Factors include: advanced age (>30men, >92 women);male gender;dyslipidemia;hypertension     Objective:    Today's Vitals   02/05/23 1521  Weight: 209 lb (94.8 kg)  Height: 5\' 11"  (1.803 m)   Body mass index is 29.15 kg/m.     02/05/2023    3:24 PM 02/01/2022   10:43 AM 01/31/2021   11:29 AM  Advanced Directives  Does Patient Have a Medical Advance Directive? No Yes Yes  Type of Special educational needs teacher of [place];Living will Healthcare Power of [place];Living will  Copy of Healthcare Power of Attorney in Chart?  No - copy requested No - copy requested  Would patient like information on creating a medical advance directive? Yes (MAU/Ambulatory/Procedural Areas - Information given)      Current Medications (verified) Outpatient Encounter Medications as of 02/05/2023  Medication Sig   acetaminophen (TYLENOL) 500 MG tablet Take 500 mg by mouth every 6 (six) hours as needed.   betamethasone dipropionate 0.05 % cream Apply topically 2 (two) times  daily.   chlorthalidone (HYGROTON) 25 MG tablet TAKE 1 TABLET (25 MG TOTAL) BY MOUTH DAILY.   Cholecalciferol (VITAMIN D3) 25 MCG (1000 UT) CAPS Take by mouth.   diphenhydramine-acetaminophen (TYLENOL PM) 25-500 MG TABS tablet Take 1 tablet by mouth at bedtime as needed.   ferrous sulfate (FE TABS) 325 (65 FE) MG EC tablet Take 1 tablet (325 mg total) by mouth daily.   omeprazole (PRILOSEC) 20 MG capsule TAKE 1 CAPSULE BY MOUTH EVERY DAY   sildenafil (REVATIO) 20 MG tablet TAKE 1 TO 5 TABLETS BY MOUTH ONCE DAILY AS NEEDED   No facility-administered encounter medications on file as of 02/05/2023.    Allergies (verified) Simvastatin   History: Past Medical History:  Diagnosis Date   Anemia    CKD (chronic kidney disease)    GERD (gastroesophageal reflux disease)    Hyperlipidemia    Hyperparathyroidism (HCC)    Hypertension    Lumbar spinal stenosis    Psoriasis    Vitamin D deficiency    History reviewed. No pertinent surgical history. Family History  Problem Relation Age of Onset   Alzheimer's disease Mother    Heart attack Father    Social History   Socioeconomic History   Marital status: Widowed    Spouse name: Not on file   Number of children: Not on file   Years of education: Not on file   Highest education level: Not on file  Occupational History   Occupation: retired  Tobacco Use   Smoking status: Former    Current packs/day:  0.00    Types: Cigarettes    Quit date: 04/13/1985    Years since quitting: 37.8   Smokeless tobacco: Never  Vaping Use   Vaping status: Never Used  Substance and Sexual Activity   Alcohol use: Yes    Alcohol/week: 1.0 standard drink of alcohol    Types: 1 Cans of beer per week    Comment: occ   Drug use: Never   Sexual activity: Not on file  Other Topics Concern   Not on file  Social History Narrative   He cares for an adopted child who is now 45 - 02/01/22   Social Determinants of Health   Financial Resource Strain: Low Risk   (02/05/2023)   Overall Financial Resource Strain (CARDIA)    Difficulty of Paying Living Expenses: Not hard at all  Food Insecurity: No Food Insecurity (02/05/2023)   Hunger Vital Sign    Worried About Running Out of Food in the Last Year: Never true    Ran Out of Food in the Last Year: Never true  Transportation Needs: No Transportation Needs (02/05/2023)   PRAPARE - Administrator, Civil Service (Medical): No    Lack of Transportation (Non-Medical): No  Physical Activity: Insufficiently Active (02/05/2023)   Exercise Vital Sign    Days of Exercise per Week: 3 days    Minutes of Exercise per Session: 30 min  Stress: No Stress Concern Present (02/05/2023)   [employer] of Occupational Health - Occupational Stress Questionnaire    Feeling of Stress : Not at all  Social Connections: Socially Isolated (02/05/2023)   Social Connection and Isolation Panel [NHANES]    Frequency of Communication with Friends and Family: More than three times a week    Frequency of Social Gatherings with Friends and Family: More than three times a week    Attends Religious Services: Never    Database administrator or Organizations: No    Attends Banker Meetings: Never    Marital Status: Widowed    Tobacco Counseling Counseling given: Not Answered   Clinical Intake:  Pre-visit preparation completed: Yes  Pain : No/denies pain     Nutritional Risks: None Diabetes: No  How often do you need to have someone help you when you read instructions, pamphlets, or other written materials from your doctor or pharmacy?: 1 - Never  Interpreter Needed?: No  Information entered by :: Renie Ora, LPN   Activities of Daily Living    02/05/2023    3:24 PM  In your present state of health, do you have any difficulty performing the following activities:  Hearing? 0  Vision? 0  Difficulty concentrating or making decisions? 0  Walking or climbing stairs? 0  Dressing or bathing?  0  Doing errands, shopping? 0  Preparing Food and eating ? N  Using the Toilet? N  In the past six months, have you accidently leaked urine? N  Do you have problems with loss of bowel control? N  Managing your Medications? N  Managing your Finances? N  Housekeeping or managing your Housekeeping? N    Patient Care Team: Gabriel Earing, FNP as PCP - General (Family Medicine) Elvis Coil, MD as Consulting Physician (Nephrology)  Indicate any recent Medical Services you may have received from other than [facility] providers in the past year (date may be approximate).     Assessment:   This is a routine wellness examination for [place].  Hearing/Vision screen Vision Screening - Comments::  Declines referral   Dietary issues and exercise activities discussed:     Goals Addressed             This Visit's Progress    Exercise 3x per week (30 min per time)         Depression Screen    02/05/2023    3:23 PM 10/24/2022    1:22 PM 07/26/2022    3:09 PM 02/01/2022   10:40 AM 01/23/2022    1:27 PM 11/14/2021   12:04 PM 07/26/2021   11:25 AM  PHQ 2/9 Scores  PHQ - 2 Score 0 0 0 0 0 0 0  PHQ- 9 Score  1  0 3  2    Fall Risk    02/05/2023    3:22 PM 10/24/2022    1:19 PM 07/26/2022    3:09 PM 02/01/2022   10:35 AM 01/23/2022    1:27 PM  Fall Risk   Falls in the past year? 0 0 0 0 0  Number falls in past yr: 0   0   Injury with Fall? 0   0   Risk for fall due to : No Fall Risks   No Fall Risks   Follow up Falls prevention discussed   Falls prevention discussed     MEDICARE RISK AT HOME:  Medicare Risk at Home - 02/05/23 1522     Any stairs in or around the home? No    If so, are there any without handrails? No    Home free of loose throw rugs in walkways, pet beds, electrical cords, etc? Yes    Adequate lighting in your home to reduce risk of falls? Yes    Life alert? No    Use of a cane, walker or w/c? No    Grab bars in the bathroom? Yes    Shower chair or bench in shower?  Yes    Elevated toilet seat or a handicapped toilet? Yes             TIMED UP AND GO:  Was the test performed?  No    Cognitive Function:        02/05/2023    3:24 PM 02/01/2022   10:42 AM 01/31/2021   11:29 AM  6CIT Screen  What Year? 0 points 0 points 0 points  What month? 0 points 0 points 0 points  What time? 0 points 0 points 0 points  Count back from 20 0 points 0 points 0 points  Months in reverse 0 points 4 points 2 points  Repeat phrase 0 points 4 points 2 points  Total Score 0 points 8 points 4 points    Immunizations  There is no immunization history on file for this patient.  TDAP status: Due, Education has been provided regarding the importance of this vaccine. Advised may receive this vaccine at local pharmacy or Health Dept. Aware to provide a copy of the vaccination record if obtained from local pharmacy or Health Dept. Verbalized acceptance and understanding.  Flu Vaccine status: Declined, Education has been provided regarding the importance of this vaccine but patient still declined. Advised may receive this vaccine at local pharmacy or Health Dept. Aware to provide a copy of the vaccination record if obtained from local pharmacy or Health Dept. Verbalized acceptance and understanding.  Pneumococcal vaccine status: Declined,  Education has been provided regarding the importance of this vaccine but patient still declined. Advised may receive this vaccine at local pharmacy or Health  Dept. Aware to provide a copy of the vaccination record if obtained from local pharmacy or Health Dept. Verbalized acceptance and understanding.   Covid-19 vaccine status: Declined, Education has been provided regarding the importance of this vaccine but patient still declined. Advised may receive this vaccine at local pharmacy or Health Dept.or vaccine clinic. Aware to provide a copy of the vaccination record if obtained from local pharmacy or Health Dept. Verbalized acceptance and  understanding.  Qualifies for Shingles Vaccine? Yes   Zostavax completed No   Shingrix Completed?: No.    Education has been provided regarding the importance of this vaccine. Patient has been advised to call insurance company to determine out of pocket expense if they have not yet received this vaccine. Advised may also receive vaccine at local pharmacy or Health Dept. Verbalized acceptance and understanding.  Screening Tests Health Maintenance  Topic Date Due   DTaP/Tdap/Td (1 - Tdap) Never done   Pneumonia Vaccine 63+ Years old (1 of 1 - PCV) 10/24/2023 (Originally 09/25/2003)   Zoster Vaccines- Shingrix (1 of 2) 10/25/2023 (Originally 09/24/1959)   INFLUENZA VACCINE  02/13/2023   Medicare Annual Wellness (AWV)  02/05/2024   HPV VACCINES  Aged Out   COVID-19 Vaccine  Discontinued    Health Maintenance  Health Maintenance Due  Topic Date Due   DTaP/Tdap/Td (1 - Tdap) Never done    Colorectal cancer screening: No longer required.   Lung Cancer Screening: (Low Dose CT Chest recommended if Age 53-80 years, 20 pack-year currently smoking OR have quit w/in 15years.) does not qualify.   Lung Cancer Screening Referral: n/a  Additional Screening:  Hepatitis C Screening: does not qualify;   Vision Screening: Recommended annual ophthalmology exams for early detection of glaucoma and other disorders of the eye. Is the patient up to date with their annual eye exam?  No  Who is the provider or what is the name of the office in which the patient attends annual eye exams? Patient declined  If pt is not established with a provider, would they like to be referred to a provider to establish care? No .   Dental Screening: Recommended annual dental exams for proper oral hygiene    Community Resource Referral / Chronic Care Management: CRR required this visit?  No   CCM required this visit?  No     Plan:     I have personally reviewed and noted the following in the patient's chart:    Medical and social history Use of alcohol, tobacco or illicit drugs  Current medications and supplements including opioid prescriptions. Patient is not currently taking opioid prescriptions. Functional ability and status Nutritional status Physical activity Advanced directives List of other physicians Hospitalizations, surgeries, and ER visits in previous 12 months Vitals Screenings to include cognitive, depression, and falls Referrals and appointments  In addition, I have reviewed and discussed with patient certain preventive protocols, quality metrics, and best practice recommendations. A written personalized care plan for preventive services as well as general preventive health recommendations were provided to patient.     Lorrene Reid, LPN   2/44/0102   After Visit Summary: (MyChart) Due to this being a telephonic visit, the after visit summary with patients personalized plan was offered to patient via MyChart   Nurse Notes: declines vaccines

## 2023-02-05 NOTE — Patient Instructions (Signed)
Isaac Beltran , Thank you for taking time to come for your Medicare Wellness Visit. I appreciate your ongoing commitment to your health goals. Please review the following plan we discussed and let me know if I can assist you in the future.   These are the goals we discussed:  Goals      Exercise 3x per week (30 min per time)     Patient Stated     Hopes to stay active and independent        This is a list of the screening recommended for you and due dates:  Health Maintenance  Topic Date Due   DTaP/Tdap/Td vaccine (1 - Tdap) Never done   Pneumonia Vaccine (1 of 1 - PCV) 10/24/2023*   Zoster (Shingles) Vaccine (1 of 2) 10/25/2023*   Flu Shot  02/13/2023   Medicare Annual Wellness Visit  02/05/2024   HPV Vaccine  Aged Out   COVID-19 Vaccine  Discontinued  *Topic was postponed. The date shown is not the original due date.    Advanced directives: Advance directive discussed with you today. I have provided a copy for you to complete at home and have notarized. Once this is complete please bring a copy in to our office so we can scan it into your chart. Information on Advanced Care Planning can be found at [facility] [place] Advance Health Care Directives Advance Health Care Directives ([email or web address]    Conditions/risks identified: Aim for 30 minutes of exercise or brisk walking, 6-8 glasses of water, and 5 servings of fruits and vegetables each day.   Next appointment: Follow up in one year for your annual wellness visit.   Preventive Care 12 Years and Older, Male  Preventive care refers to lifestyle choices and visits with your health care provider that can promote health and wellness. What does preventive care include? A yearly physical exam. This is also called an annual well check. Dental exams once or twice a year. Routine eye exams. Ask your health care provider how often you should have your eyes checked. Personal lifestyle choices, including: Daily care of your  teeth and gums. Regular physical activity. Eating a healthy diet. Avoiding tobacco and drug use. Limiting alcohol use. Practicing safe sex. Taking low doses of aspirin every day. Taking vitamin and mineral supplements as recommended by your health care provider. What happens during an annual well check? The services and screenings done by your health care provider during your annual well check will depend on your age, overall health, lifestyle risk factors, and family history of disease. Counseling  Your health care provider may ask you questions about your: Alcohol use. Tobacco use. Drug use. Emotional well-being. Home and relationship well-being. Sexual activity. Eating habits. History of falls. Memory and ability to understand (cognition). Work and work Astronomer. Screening  You may have the following tests or measurements: Height, weight, and BMI. Blood pressure. Lipid and cholesterol levels. These may be checked every 5 years, or more frequently if you are over 2 years old. Skin check. Lung cancer screening. You may have this screening every year starting at age 35 if you have a 30-pack-year history of smoking and currently smoke or have quit within the past 15 years. Fecal occult blood test (FOBT) of the stool. You may have this test every year starting at age 51. Flexible sigmoidoscopy or colonoscopy. You may have a sigmoidoscopy every 5 years or a colonoscopy every 10 years starting at age 74. Prostate cancer screening. Recommendations will  vary depending on your family history and other risks. Hepatitis C blood test. Hepatitis B blood test. Sexually transmitted disease (STD) testing. Diabetes screening. This is done by checking your blood sugar (glucose) after you have not eaten for a while (fasting). You may have this done every 1-3 years. Abdominal aortic aneurysm (AAA) screening. You may need this if you are a current or former smoker. Osteoporosis. You may be  screened starting at age 48 if you are at high risk. Talk with your health care provider about your test results, treatment options, and if necessary, the need for more tests. Vaccines  Your health care provider may recommend certain vaccines, such as: Influenza vaccine. This is recommended every year. Tetanus, diphtheria, and acellular pertussis (Tdap, Td) vaccine. You may need a Td booster every 10 years. Zoster vaccine. You may need this after age 62. Pneumococcal 13-valent conjugate (PCV13) vaccine. One dose is recommended after age 40. Pneumococcal polysaccharide (PPSV23) vaccine. One dose is recommended after age 29. Talk to your health care provider about which screenings and vaccines you need and how often you need them. This information is not intended to replace advice given to you by your health care provider. Make sure you discuss any questions you have with your health care provider. Document Released: 07/28/2015 Document Revised: 03/20/2016 Document Reviewed: 05/02/2015 Elsevier Interactive Patient Education  2017 [employer].  Fall Prevention in the Home Falls can cause injuries. They can happen to people of all ages. There are many things you can do to make your home safe and to help prevent falls. What can I do on the outside of my home? Regularly fix the edges of walkways and driveways and fix any cracks. Remove anything that might make you trip as you walk through a door, such as a raised step or threshold. Trim any bushes or trees on the path to your home. Use bright outdoor lighting. Clear any walking paths of anything that might make someone trip, such as rocks or tools. Regularly check to see if handrails are loose or broken. Make sure that both sides of any steps have handrails. Any raised decks and porches should have guardrails on the edges. Have any leaves, snow, or ice cleared regularly. Use sand or salt on walking paths during winter. Clean up any spills in  your garage right away. This includes oil or grease spills. What can I do in the bathroom? Use night lights. Install grab bars by the toilet and in the tub and shower. Do not use towel bars as grab bars. Use non-skid mats or decals in the tub or shower. If you need to sit down in the shower, use a plastic, non-slip stool. Keep the floor dry. Clean up any water that spills on the floor as soon as it happens. Remove soap buildup in the tub or shower regularly. Attach bath mats securely with double-sided non-slip rug tape. Do not have throw rugs and other things on the floor that can make you trip. What can I do in the bedroom? Use night lights. Make sure that you have a light by your bed that is easy to reach. Do not use any sheets or blankets that are too big for your bed. They should not hang down onto the floor. Have a firm chair that has side arms. You can use this for support while you get dressed. Do not have throw rugs and other things on the floor that can make you trip. What can I do in  the kitchen? Clean up any spills right away. Avoid walking on wet floors. Keep items that you use a lot in easy-to-reach places. If you need to reach something above you, use a strong step stool that has a grab bar. Keep electrical cords out of the way. Do not use floor polish or wax that makes floors slippery. If you must use wax, use non-skid floor wax. Do not have throw rugs and other things on the floor that can make you trip. What can I do with my stairs? Do not leave any items on the stairs. Make sure that there are handrails on both sides of the stairs and use them. Fix handrails that are broken or loose. Make sure that handrails are as long as the stairways. Check any carpeting to make sure that it is firmly attached to the stairs. Fix any carpet that is loose or worn. Avoid having throw rugs at the top or bottom of the stairs. If you do have throw rugs, attach them to the floor with carpet  tape. Make sure that you have a light switch at the top of the stairs and the bottom of the stairs. If you do not have them, ask someone to add them for you. What else can I do to help prevent falls? Wear shoes that: Do not have high heels. Have rubber bottoms. Are comfortable and fit you well. Are closed at the toe. Do not wear sandals. If you use a stepladder: Make sure that it is fully opened. Do not climb a closed stepladder. Make sure that both sides of the stepladder are locked into place. Ask someone to hold it for you, if possible. Clearly mark and make sure that you can see: Any grab bars or handrails. First and last steps. Where the edge of each step is. Use tools that help you move around (mobility aids) if they are needed. These include: Canes. Walkers. Scooters. Crutches. Turn on the lights when you go into a dark area. Replace any light bulbs as soon as they burn out. Set up your furniture so you have a clear path. Avoid moving your furniture around. If any of your floors are uneven, fix them. If there are any pets around you, be aware of where they are. Review your medicines with your doctor. Some medicines can make you feel dizzy. This can increase your chance of falling. Ask your doctor what other things that you can do to help prevent falls. This information is not intended to replace advice given to you by your health care provider. Make sure you discuss any questions you have with your health care provider. Document Released: 04/27/2009 Document Revised: 12/07/2015 Document Reviewed: 08/05/2014 Elsevier Interactive Patient Education  2017 [employer].

## 2023-03-14 ENCOUNTER — Other Ambulatory Visit: Payer: Self-pay | Admitting: Family Medicine

## 2023-03-14 DIAGNOSIS — L309 Dermatitis, unspecified: Secondary | ICD-10-CM

## 2023-03-14 NOTE — Telephone Encounter (Signed)
Last seen 10/24/22  Next appt 04/25/23 for CPE

## 2023-03-19 ENCOUNTER — Encounter: Payer: Self-pay | Admitting: Family Medicine

## 2023-03-19 ENCOUNTER — Ambulatory Visit (INDEPENDENT_AMBULATORY_CARE_PROVIDER_SITE_OTHER): Payer: Medicare Other

## 2023-03-19 ENCOUNTER — Ambulatory Visit (INDEPENDENT_AMBULATORY_CARE_PROVIDER_SITE_OTHER): Payer: Medicare Other | Admitting: Family Medicine

## 2023-03-19 VITALS — BP 138/62 | HR 78 | Temp 98.2°F | Ht 71.0 in | Wt 193.1 lb

## 2023-03-19 DIAGNOSIS — M21371 Foot drop, right foot: Secondary | ICD-10-CM

## 2023-03-19 DIAGNOSIS — M5127 Other intervertebral disc displacement, lumbosacral region: Secondary | ICD-10-CM | POA: Diagnosis not present

## 2023-03-19 DIAGNOSIS — I700 Atherosclerosis of aorta: Secondary | ICD-10-CM | POA: Diagnosis not present

## 2023-03-19 DIAGNOSIS — M5416 Radiculopathy, lumbar region: Secondary | ICD-10-CM | POA: Diagnosis not present

## 2023-03-19 DIAGNOSIS — M47816 Spondylosis without myelopathy or radiculopathy, lumbar region: Secondary | ICD-10-CM | POA: Diagnosis not present

## 2023-03-19 DIAGNOSIS — M5136 Other intervertebral disc degeneration, lumbar region: Secondary | ICD-10-CM | POA: Diagnosis not present

## 2023-03-19 DIAGNOSIS — R634 Abnormal weight loss: Secondary | ICD-10-CM | POA: Diagnosis not present

## 2023-03-19 LAB — URINALYSIS, ROUTINE W REFLEX MICROSCOPIC
Bilirubin, UA: NEGATIVE
Glucose, UA: NEGATIVE
Leukocytes,UA: NEGATIVE
Nitrite, UA: NEGATIVE
Specific Gravity, UA: 1.025 (ref 1.005–1.030)
Urobilinogen, Ur: 0.2 mg/dL (ref 0.2–1.0)
pH, UA: 5 (ref 5.0–7.5)

## 2023-03-19 LAB — MICROSCOPIC EXAMINATION
RBC, Urine: NONE SEEN /HPF (ref 0–2)
Renal Epithel, UA: NONE SEEN /HPF
WBC, UA: NONE SEEN /HPF (ref 0–5)
Yeast, UA: NONE SEEN

## 2023-03-19 NOTE — Progress Notes (Signed)
Acute Office Visit  Subjective:     Patient ID: Isaac Beltran, male    DOB: 1941-03-02, 82 y.o.   MRN: [id number]  Chief Complaint  Patient presents with   Numbness    HPI Patient is in today for numbness of his right foot from the last 2-3 months. He feels like he is slapping his foot down when he walks. He reports tripping over the foot frequently and stumbling. Denies difficulty with stairs. No injury. Denies pain in foot. He also repots some intermittent numbness in his right lower leg. Denies back pain though he has a hx of back problems.   He has lost 16 lbs in 6 weeks without intention. Reports decreased appetite. He eat bacon and eggs for breakfast. He sometimes eats lunch. Eats a meal at dinner. Doesn't usually eat snacks. Denies night sweats, pain, nausea, vomiting, abdominal pain, fevers, fatigue, shortness of breath, cough, depression.  ROS As per HPI.      Objective:    BP 138/62   Pulse 78   Temp 98.2 F (36.8 C) (Temporal)   Ht 5\' 11"  (1.803 m)   Wt 193 lb 2 oz (87.6 kg)   SpO2 94%   BMI 26.94 kg/m  Wt Readings from Last 3 Encounters:  03/19/23 193 lb 2 oz (87.6 kg)  02/05/23 209 lb (94.8 kg)  10/24/22 207 lb 2 oz (94 kg)      Physical Exam Vitals and nursing note reviewed.  Constitutional:      General: He is not in acute distress.    Appearance: He is not ill-appearing, toxic-appearing or diaphoretic.  HENT:     Head: Normocephalic and atraumatic.     Nose: Nose normal.     Mouth/Throat:     Mouth: Mucous membranes are moist.     Pharynx: Oropharynx is clear.  Eyes:     General: No scleral icterus.    Extraocular Movements: Extraocular movements intact.     Pupils: Pupils are equal, round, and reactive to light.  Neck:     Thyroid: No thyroid mass, thyromegaly or thyroid tenderness.  Cardiovascular:     Rate and Rhythm: Normal rate and regular rhythm.     Heart sounds: Normal heart sounds. No murmur heard. Pulmonary:     Effort:  Pulmonary effort is normal. No respiratory distress.     Breath sounds: Normal breath sounds. No wheezing.  Abdominal:     General: Bowel sounds are normal. There is no distension.     Palpations: Abdomen is soft.     Tenderness: There is no abdominal tenderness. There is no guarding or rebound.  Musculoskeletal:     Cervical back: Neck supple. No rigidity.     Lumbar back: No swelling, edema, tenderness or bony tenderness. Positive right straight leg raise test.     Right lower leg: No swelling, tenderness or bony tenderness. No edema.     Left lower leg: No swelling, tenderness or bony tenderness. No edema.     Right ankle: No swelling. No tenderness. Normal range of motion.     Right Achilles Tendon: No tenderness.     Left ankle: No swelling. No tenderness. Normal range of motion.     Left Achilles Tendon: No tenderness.  Lymphadenopathy:     Cervical: No cervical adenopathy.  Skin:    General: Skin is warm and dry.     Coloration: Skin is not jaundiced.  Neurological:     Mental Status: He is alert  and oriented to person, place, and time.     Cranial Nerves: No facial asymmetry.     Sensory: Sensation is intact.     Motor: No weakness or tremor.     Gait: Gait abnormal (right foot drop).  Psychiatric:        Mood and Affect: Mood normal.        Behavior: Behavior normal.     No results found for any visits on 03/19/23.      Assessment & Plan:   Kennet was seen today for numbness.  Diagnoses and all orders for this visit:  Unintentional weight loss Unsure of etiology. Loss of 16 lbs over 6 weeks. Labs and imaging pending as below.  -     CBC with Differential/Platelet -     CMP14+EGFR -     TSH -     C-reactive protein -     HIV antibody (with reflex) -     Hepatitis C antibody -     Urinalysis, Routine w reflex microscopic -     Fecal occult blood, imunochemical([facility]) -     DG Chest 2 View; Future -     Microscopic Examination  Foot drop,  right Referral to PT. Lumbar xray pending as he also has a + SLR on right.  -     Ambulatory referral to Physical Therapy  Lumbar radiculopathy -     DG Lumbar Spine 2-3 Views   Return in about 6 weeks (around 04/30/2023) for weight, foot drop. Sooner for new or worsening symptoms.   The patient indicates understanding of these issues and agrees with the plan.  Gabriel Earing, FNP

## 2023-03-20 ENCOUNTER — Other Ambulatory Visit: Payer: Medicare Other

## 2023-03-20 DIAGNOSIS — R634 Abnormal weight loss: Secondary | ICD-10-CM | POA: Diagnosis not present

## 2023-03-20 LAB — CMP14+EGFR
ALT: 13 IU/L (ref 0–44)
AST: 21 IU/L (ref 0–40)
Albumin: 4.4 g/dL (ref 3.7–4.7)
Alkaline Phosphatase: 68 IU/L (ref 44–121)
BUN/Creatinine Ratio: 16 (ref 10–24)
BUN: 30 mg/dL — ABNORMAL HIGH (ref 8–27)
Bilirubin Total: 0.8 mg/dL (ref 0.0–1.2)
CO2: 21 mmol/L (ref 20–29)
Calcium: 9.6 mg/dL (ref 8.6–10.2)
Chloride: 107 mmol/L — ABNORMAL HIGH (ref 96–106)
Creatinine, Ser: 1.89 mg/dL — ABNORMAL HIGH (ref 0.76–1.27)
Globulin, Total: 2.5 g/dL (ref 1.5–4.5)
Glucose: 93 mg/dL (ref 70–99)
Potassium: 4.7 mmol/L (ref 3.5–5.2)
Sodium: 142 mmol/L (ref 134–144)
Total Protein: 6.9 g/dL (ref 6.0–8.5)
eGFR: 35 mL/min/{1.73_m2} — ABNORMAL LOW (ref >59)

## 2023-03-20 LAB — CBC WITH DIFFERENTIAL/PLATELET
Basophils Absolute: 0.1 10*3/uL (ref 0.0–0.2)
Basos: 1 %
EOS (ABSOLUTE): 0.5 10*3/uL — ABNORMAL HIGH (ref 0.0–0.4)
Eos: 8 %
Hematocrit: 37.4 % — ABNORMAL LOW (ref 37.5–51.0)
Hemoglobin: 12 g/dL — ABNORMAL LOW (ref 13.0–17.7)
Immature Grans (Abs): 0 10*3/uL (ref 0.0–0.1)
Immature Granulocytes: 0 %
Lymphocytes Absolute: 1.7 10*3/uL (ref 0.7–3.1)
Lymphs: 25 %
MCH: 30.2 pg (ref 26.6–33.0)
MCHC: 32.1 g/dL (ref 31.5–35.7)
MCV: 94 fL (ref 79–97)
Monocytes Absolute: 0.4 10*3/uL (ref 0.1–0.9)
Monocytes: 6 %
Neutrophils Absolute: 4 10*3/uL (ref 1.4–7.0)
Neutrophils: 60 %
Platelets: 320 10*3/uL (ref 150–450)
RBC: 3.98 x10E6/uL — ABNORMAL LOW (ref 4.14–5.80)
RDW: 13.5 % (ref 11.6–15.4)
WBC: 6.8 10*3/uL (ref 3.4–10.8)

## 2023-03-20 LAB — C-REACTIVE PROTEIN: CRP: 2 mg/L (ref 0–10)

## 2023-03-20 LAB — HIV ANTIBODY (ROUTINE TESTING W REFLEX): HIV Screen 4th Generation wRfx: NONREACTIVE

## 2023-03-20 LAB — HEPATITIS C ANTIBODY: Hep C Virus Ab: NONREACTIVE

## 2023-03-20 LAB — TSH: TSH: 0.853 u[IU]/mL (ref 0.450–4.500)

## 2023-03-21 LAB — FECAL OCCULT BLOOD, IMMUNOCHEMICAL: Fecal Occult Bld: NEGATIVE

## 2023-03-26 ENCOUNTER — Telehealth: Payer: Self-pay

## 2023-03-26 ENCOUNTER — Other Ambulatory Visit: Payer: Self-pay

## 2023-03-26 ENCOUNTER — Ambulatory Visit: Payer: Medicare Other | Attending: Family Medicine

## 2023-03-26 VITALS — Wt 192.0 lb

## 2023-03-26 DIAGNOSIS — M21371 Foot drop, right foot: Secondary | ICD-10-CM | POA: Insufficient documentation

## 2023-03-26 DIAGNOSIS — M6281 Muscle weakness (generalized): Secondary | ICD-10-CM | POA: Diagnosis not present

## 2023-03-26 DIAGNOSIS — R2689 Other abnormalities of gait and mobility: Secondary | ICD-10-CM | POA: Diagnosis not present

## 2023-03-26 NOTE — Telephone Encounter (Signed)
Pt states that he has been losing a lot of weight recently without trying to. He was seen 9/4 by PCP and seen PT today and states he has lost a lb in between appointments. He is concerned as to why he is losing so much weight. He is supposed to follow up with PCP in 6 weeks but pt states he does not think he will make it 6 weeks because he believes something is wrong with his body. Informed pt I will put a message in for him and will call him back.

## 2023-03-26 NOTE — Therapy (Signed)
OUTPATIENT PHYSICAL THERAPY LOWER EXTREMITY EVALUATION   Patient Name: Isaac Beltran MRN: [id number] DOB:28-Jun-1941, 82 y.o., male Today's Date: 03/26/2023  END OF SESSION:  PT End of Session - 03/26/23 1303     Visit Number 1    Number of Visits 4    Date for PT Re-Evaluation 05/09/23    PT Start Time 1304    PT Stop Time 1342    PT Time Calculation (min) 38 min    Activity Tolerance Patient tolerated treatment well    Behavior During Therapy WFL for tasks assessed/performed             Past Medical History:  Diagnosis Date   Anemia    CKD (chronic kidney disease)    GERD (gastroesophageal reflux disease)    Hyperlipidemia    Hyperparathyroidism (HCC)    Hypertension    Lumbar spinal stenosis    Psoriasis    Vitamin D deficiency    History reviewed. No pertinent surgical history. Patient Active Problem List   Diagnosis Date Noted   Eczema of lower extremity 01/23/2022   Controlled substance agreement signed 07/29/2021   Difficulty sleeping 04/25/2021   Stage 3b chronic kidney disease (HCC) 01/28/2021   GERD (gastroesophageal reflux disease) 04/13/2020   Hyperlipidemia 04/13/2020   Essential hypertension 04/13/2020   REFERRING PROVIDER: Gabriel Earing, FNP   REFERRING DIAG: Foot drop, right   THERAPY DIAG:  Other abnormalities of gait and mobility  Muscle weakness (generalized)  Rationale for Evaluation and Treatment: Rehabilitation  ONSET DATE: July 2024  SUBJECTIVE:   SUBJECTIVE STATEMENT: Patient reports that he began experiencing foot drop about 2 months ago. He had not had any problems walking prior to this. He had a fall about 2-3 weeks ago when his right toe got caught resulted in him losing his balance and falling.   PERTINENT HISTORY: Hypertension and chronic kidney disease PAIN:  Are you having pain? No  PRECAUTIONS: Fall  RED FLAGS: None   WEIGHT BEARING RESTRICTIONS: No  FALLS:  Has patient fallen in last 6 months? Yes.  Number of falls 2  LIVING ENVIRONMENT: Lives with: lives alone Lives in: House/apartment Stairs: No Has following equipment at home: None  OCCUPATION: retired   PLOF: Independent  PATIENT GOALS: be able to walk normal   NEXT MD VISIT: 04/25/23  OBJECTIVE:   DIAGNOSTIC FINDINGS: Patient had a lumbar x-ray on 03/19/23, but no results from this imaging at this time.  However, scoliosis and decreased disc height was observed in these x-rays.  COGNITION: Overall cognitive status: Within functional limits for tasks assessed     SENSATION: Light touch: Impaired  and diminished sensation throughout RLE with no dermatomal pattern Patient reports intermittent numbness in both legs that "has been going on a while"   EDEMA:  No edema observed  DEEP TENDON REFLEXES:  Patellar: R: 0  L: 1+  Achilles: R: 0  L: 1+  PALPATION: No tenderness to palpation  LOWER EXTREMITY ROM:  Active ROM Right eval Left eval  Hip flexion    Hip extension    Hip abduction    Hip adduction    Hip internal rotation    Hip external rotation    Knee flexion    Knee extension    Ankle dorsiflexion -20 (no PROM limitations in right ankle)   4  Ankle plantarflexion    Ankle inversion    Ankle eversion     (Blank rows = not tested)  LOWER EXTREMITY MMT:  MMT Right eval Left eval  Hip flexion 4+/5 4+/5  Hip extension    Hip abduction    Hip adduction    Hip internal rotation    Hip external rotation    Knee flexion 4+/5 5/5  Knee extension 5/5 5/5  Ankle dorsiflexion 1/5 3/5  Ankle plantarflexion    Ankle inversion    Ankle eversion     (Blank rows = not tested)  GAIT: Assistive device utilized: None Level of assistance: Complete Independence Comments: Right foot drop resulting in poor right foot clearance Gait speed: 0.93 m/s    TODAY'S TREATMENT:                                                                                                                              DATE:      PATIENT EDUCATION:  Education details: Plan of care, prognosis, objective findings, x-ray results, and goals for therapy Person educated: Patient Education method: Explanation Education comprehension: verbalized understanding  HOME EXERCISE PROGRAM:   ASSESSMENT:  CLINICAL IMPRESSION: Patient is a 82 y.o. male who was seen today for physical therapy evaluation and treatment for right foot drop.  He presented with right foot drop with ambulation.  He exhibited minimal muscular engagement of his right foot dorsiflexors which resulted in reduced right ankle mobility.  He also exhibited diminished sensation in his right lower extremity with no dermatomal pattern along with absent deep tendon reflexes on the right lower extremity.  He may benefit from additional medical assessment due to his reported unexpected weight loss in recent months.  Physical therapy will be initiated in an attempt to address his gait deviations to improve his functional mobility.  OBJECTIVE IMPAIRMENTS: Abnormal gait, decreased balance, decreased mobility, difficulty walking, decreased ROM, decreased strength, and impaired sensation.   ACTIVITY LIMITATIONS: standing and locomotion level  PARTICIPATION LIMITATIONS: shopping, community activity, and yard work  PERSONAL FACTORS: Age, Past/current experiences, and 1-2 comorbidities: Hypertension chronic kidney disease  are also affecting patient's functional outcome.   REHAB POTENTIAL: Fair    CLINICAL DECISION MAKING: Evolving/moderate complexity  EVALUATION COMPLEXITY: Moderate   GOALS: Goals reviewed with patient? Yes  LONG TERM GOALS: Target date: 04/23/23  Patient will be independent with his HEP. Baseline:  Goal status: INITIAL  2.  Patient will be able to ambulate with no significant gait deviations. Baseline:  Goal status: INITIAL  3.  Patient will improve his gait speed to at least 1.2 m/s for improved community mobility. Baseline:  Goal  status: INITIAL  PLAN:  PT FREQUENCY: 1-2x/week  PT DURATION: 4 weeks  PLANNED INTERVENTIONS: Therapeutic exercises, Therapeutic activity, Neuromuscular re-education, Balance training, Gait training, Patient/Family education, Self Care, Stair training, Electrical stimulation, Cryotherapy, Moist heat, Manual therapy, and Re-evaluation  PLAN FOR NEXT SESSION: NuStep, gait training, and balance interventions   Granville Lewis, PT 03/26/2023, 4:59 PM

## 2023-03-26 NOTE — Telephone Encounter (Signed)
Disregard previous message as his results are now back. Chest xray was negative. Lumbar xray with stable compression deformity of L1 similar to 2016 MRI, mild curvature of spine, and moderate DDD. This is consistent with nerve compression to cause foot drop that he is experiencing. I would like him to continue PT for his foot drop. Can place referral to ortho if this doesn't improve with PT.   As far as weight loss and decreased appetite, I haven't been able to find a cause for this yet. Any symptoms of depression as this can cause decreased appetite and weight loss? I'm going to discuss with my supervising physician for next steps if he doesn't have any signs of depression.

## 2023-03-28 NOTE — Telephone Encounter (Signed)
Patient calling back. Said his phone had been out for a few days.

## 2023-04-25 ENCOUNTER — Encounter: Payer: Medicare Other | Admitting: Family Medicine

## 2023-04-28 ENCOUNTER — Encounter: Payer: Self-pay | Admitting: Family Medicine

## 2023-05-06 ENCOUNTER — Other Ambulatory Visit: Payer: Self-pay | Admitting: Family Medicine

## 2023-05-06 DIAGNOSIS — N529 Male erectile dysfunction, unspecified: Secondary | ICD-10-CM

## 2023-05-12 ENCOUNTER — Other Ambulatory Visit: Payer: Self-pay | Admitting: *Deleted

## 2023-05-12 DIAGNOSIS — N529 Male erectile dysfunction, unspecified: Secondary | ICD-10-CM

## 2023-05-14 DIAGNOSIS — K219 Gastro-esophageal reflux disease without esophagitis: Secondary | ICD-10-CM | POA: Diagnosis not present

## 2023-05-14 DIAGNOSIS — I129 Hypertensive chronic kidney disease with stage 1 through stage 4 chronic kidney disease, or unspecified chronic kidney disease: Secondary | ICD-10-CM | POA: Diagnosis not present

## 2023-05-14 DIAGNOSIS — N1832 Chronic kidney disease, stage 3b: Secondary | ICD-10-CM | POA: Diagnosis not present

## 2023-05-15 LAB — LAB REPORT - SCANNED
Albumin, Urine POC: 175.7
Creatinine, POC: 106 mg/dL
EGFR: 42
Microalb Creat Ratio: 166

## 2023-06-10 ENCOUNTER — Other Ambulatory Visit: Payer: Self-pay | Admitting: Family Medicine

## 2023-06-10 DIAGNOSIS — K219 Gastro-esophageal reflux disease without esophagitis: Secondary | ICD-10-CM

## 2023-06-24 ENCOUNTER — Other Ambulatory Visit: Payer: Self-pay | Admitting: Family Medicine

## 2023-06-24 DIAGNOSIS — K219 Gastro-esophageal reflux disease without esophagitis: Secondary | ICD-10-CM

## 2023-06-24 MED ORDER — CHLORTHALIDONE 25 MG PO TABS: 25.0000 mg | ORAL_TABLET | Freq: Every day | ORAL | 0 refills | Status: DC

## 2023-06-24 MED ORDER — OMEPRAZOLE 20 MG PO CPDR: 20.0000 mg | DELAYED_RELEASE_CAPSULE | Freq: Every day | ORAL | 0 refills | Status: DC

## 2023-06-24 NOTE — Addendum Note (Signed)
Addended by: Julious Payer D on: 06/24/2023 02:43 PM   Modules accepted: Orders

## 2023-06-24 NOTE — Telephone Encounter (Signed)
I called pt & I made him an appt w/TM on 07-18-2023 at 2:45pm.

## 2023-06-24 NOTE — Telephone Encounter (Signed)
Isaac Beltran pt NTBS 30-d given 06/10/23

## 2023-07-18 ENCOUNTER — Encounter: Payer: Self-pay | Admitting: Family Medicine

## 2023-07-18 ENCOUNTER — Ambulatory Visit (INDEPENDENT_AMBULATORY_CARE_PROVIDER_SITE_OTHER): Payer: Medicare Other | Admitting: Family Medicine

## 2023-07-18 VITALS — BP 144/66 | HR 73 | Temp 98.0°F | Ht 71.0 in | Wt 202.0 lb

## 2023-07-18 DIAGNOSIS — I10 Essential (primary) hypertension: Secondary | ICD-10-CM | POA: Diagnosis not present

## 2023-07-18 DIAGNOSIS — K219 Gastro-esophageal reflux disease without esophagitis: Secondary | ICD-10-CM | POA: Diagnosis not present

## 2023-07-18 DIAGNOSIS — N529 Male erectile dysfunction, unspecified: Secondary | ICD-10-CM

## 2023-07-18 DIAGNOSIS — N1832 Chronic kidney disease, stage 3b: Secondary | ICD-10-CM

## 2023-07-18 DIAGNOSIS — R634 Abnormal weight loss: Secondary | ICD-10-CM | POA: Diagnosis not present

## 2023-07-18 DIAGNOSIS — L309 Dermatitis, unspecified: Secondary | ICD-10-CM

## 2023-07-18 DIAGNOSIS — E782 Mixed hyperlipidemia: Secondary | ICD-10-CM | POA: Diagnosis not present

## 2023-07-18 MED ORDER — OMEPRAZOLE 20 MG PO CPDR: 20.0000 mg | DELAYED_RELEASE_CAPSULE | Freq: Every day | ORAL | 3 refills | Status: DC

## 2023-07-18 MED ORDER — CHLORTHALIDONE 25 MG PO TABS: 25.0000 mg | ORAL_TABLET | Freq: Every day | ORAL | 3 refills | Status: AC

## 2023-07-18 MED ORDER — SILDENAFIL CITRATE 20 MG PO TABS: ORAL_TABLET | ORAL | 3 refills | Status: AC

## 2023-07-18 MED ORDER — BETAMETHASONE DIPROPIONATE 0.05 % EX CREA: TOPICAL_CREAM | Freq: Two times a day (BID) | CUTANEOUS | 2 refills | Status: AC

## 2023-07-18 NOTE — Progress Notes (Signed)
 Established Patient Office Visit  Subjective   Patient ID: Isaac Beltran, male    DOB: June 30, 1941  Age: 83 y.o. MRN: [id number]  Chief Complaint  Patient presents with   Medical Management of Chronic Issues    HPI HTN Complaint with meds - Yes Current Medications - chlorthalidone  25 mg. He did not take his medication today as he has been out.  Checking BP at home - no Pertinent ROS:  Headache - No Fatigue - No Visual Disturbances - No Chest pain - No Dyspnea - No Palpitations - No LE edema - No  2. HLD Last LDL was 103. Not currently on medications. He is not fasting today.  3. GERD Compliant with medications - Yes Current medications - prilosec 20 mg Sore throat - No Voice change - No Hemoptysis - No Dysphagia or dyspepsia - No Water brash - No Red Flags (weight loss, hematochezia, melena, weight loss, early satiety, fevers, odynophagia, or persistent vomiting) - No  Hx of esophogeal strictures.   4. Unintentional weight loss He has gained 10 lbs since his last visit. Reports good appetite. He has been eating well.      Past Medical History:  Diagnosis Date   Anemia    CKD (chronic kidney disease)    GERD (gastroesophageal reflux disease)    Hyperlipidemia    Hyperparathyroidism (HCC)    Hypertension    Lumbar spinal stenosis    Psoriasis    Vitamin D  deficiency       ROS As per HPI.    Objective:     BP (!) 144/66   Pulse 73   Temp 98 F (36.7 C) (Temporal)   Ht 5' 11 (1.803 m)   Wt 202 lb (91.6 kg)   SpO2 98%   BMI 28.17 kg/m  BP Readings from Last 3 Encounters:  07/18/23 (!) 144/66  03/19/23 138/62  10/24/22 (!) 147/64   Wt Readings from Last 3 Encounters:  07/18/23 202 lb (91.6 kg)  03/26/23 192 lb (87.1 kg)  03/19/23 193 lb 2 oz (87.6 kg)     Physical Exam Vitals and nursing note reviewed.  Constitutional:      General: He is not in acute distress.    Appearance: He is not ill-appearing, toxic-appearing or diaphoretic.   Eyes:     General:        Right eye: No discharge.        Left eye: No discharge.     Extraocular Movements: Extraocular movements intact.     Pupils: Pupils are equal, round, and reactive to light.  Neck:     Vascular: No carotid bruit.  Cardiovascular:     Rate and Rhythm: Normal rate and regular rhythm.     Heart sounds: Normal heart sounds. No murmur heard. Pulmonary:     Effort: Pulmonary effort is normal. No respiratory distress.     Breath sounds: Normal breath sounds.  Abdominal:     General: Bowel sounds are normal. There is no distension.     Palpations: Abdomen is soft.     Tenderness: There is no abdominal tenderness. There is no guarding or rebound.  Musculoskeletal:     Cervical back: Neck supple. No rigidity.     Right lower leg: No edema.     Left lower leg: No edema.  Skin:    General: Skin is warm and dry.  Neurological:     General: No focal deficit present.     Mental Status: He  is alert and oriented to person, place, and time.     Cranial Nerves: No cranial nerve deficit.     Motor: No weakness.     Coordination: Coordination normal.     Gait: Gait normal.  Psychiatric:        Mood and Affect: Mood normal.        Behavior: Behavior normal.    No results found for any visits on 07/18/23.   The ASCVD Risk score (Arnett DK, et al., 2019) failed to calculate for the following reasons:   The 2019 ASCVD risk score is only valid for ages 16 to 74    Assessment & Plan:   Collyn was seen today for medical management of chronic issues.  Diagnoses and all orders for this visit:  Primary hypertension BP not at goal today, however he did not take his mediation today. Refills provided. Will recheck BP in 2 weeks.  -     chlorthalidone  (HYGROTON ) 25 MG tablet; Take 1 tablet (25 mg total) by mouth daily.  Mixed hyperlipidemia Last LDL 103. Not fasting today.   Stage 3b chronic kidney disease (HCC) Managed by nephrology. Reviewed last labs in [facility]  database.   Gastroesophageal reflux disease, unspecified whether esophagitis present Well controlled on current regimen.  -     omeprazole  (PRILOSEC) 20 MG capsule; Take 1 capsule (20 mg total) by mouth daily.  Eczema of lower extremity Refill provided.  -     betamethasone  dipropionate 0.05 % cream; Apply topically 2 (two) times daily.  Erectile dysfunction, unspecified erectile dysfunction type Refill provided.  -     sildenafil  (REVATIO ) 20 MG tablet; TAKE 1 TO 5 TABLETS BY MOUTH ONCE DAILY AS NEEDED  Unintentional weight loss Resolved. He has now gained 10 lbs.    Return in about 2 weeks (around 08/01/2023) for nurse visit for BP check, 6 months with me.   The patient indicates understanding of these issues and agrees with the plan.  Annabella CHRISTELLA Search, FNP

## 2023-07-18 NOTE — Patient Instructions (Signed)
 Vitamin D3 [phone] international units a day.

## 2023-08-05 ENCOUNTER — Ambulatory Visit: Payer: Medicare Other | Admitting: *Deleted

## 2023-08-05 VITALS — BP 176/76 | HR 61

## 2023-08-05 DIAGNOSIS — I10 Essential (primary) hypertension: Secondary | ICD-10-CM

## 2023-08-05 NOTE — Progress Notes (Signed)
Patient in today for BP check. First reading 180/73 64 heart rate. Second reading 176/76 61 heart rate.

## 2023-08-13 ENCOUNTER — Telehealth: Payer: Self-pay | Admitting: Family Medicine

## 2023-08-13 MED ORDER — LOSARTAN POTASSIUM 25 MG PO TABS: 25.0000 mg | ORAL_TABLET | Freq: Every day | ORAL | 3 refills | Status: DC

## 2023-08-13 NOTE — Addendum Note (Signed)
Addended by: Gabriel Earing on: 08/13/2023 09:02 AM   Modules accepted: Orders

## 2023-08-13 NOTE — Telephone Encounter (Signed)
Copied from CRM [id number]. Topic: Clinical - Medical Advice >> Aug 13, 2023  9:53 AM Isaac Beltran wrote: Reason for CRM: Patient requesting a call back regarding his Blood pressure medication Chlorthalidone 25 mg had a last visit on 08/05/23 want to know if provider going to change the medication reason when had his visit his blood pressure was high

## 2023-08-13 NOTE — Telephone Encounter (Signed)
Start losartan 25 mg daily. Continue chlorthalidone daily. Follow up in 2 weeks with me for HTN.

## 2023-08-13 NOTE — Telephone Encounter (Signed)
Pt is aware of provider feedback and has 2 week follow up scheduled already for 2/12.

## 2023-08-27 ENCOUNTER — Encounter: Payer: Self-pay | Admitting: Family Medicine

## 2023-08-27 ENCOUNTER — Ambulatory Visit (INDEPENDENT_AMBULATORY_CARE_PROVIDER_SITE_OTHER): Payer: Medicare Other | Admitting: Family Medicine

## 2023-08-27 VITALS — BP 142/60 | HR 65 | Temp 98.2°F | Ht 71.0 in | Wt 197.8 lb

## 2023-08-27 DIAGNOSIS — I10 Essential (primary) hypertension: Secondary | ICD-10-CM | POA: Diagnosis not present

## 2023-08-27 DIAGNOSIS — N1832 Chronic kidney disease, stage 3b: Secondary | ICD-10-CM | POA: Diagnosis not present

## 2023-08-27 NOTE — Progress Notes (Signed)
   Acute Office Visit  Subjective:     Patient ID: Isaac Beltran, male    DOB: 1940-08-04, 83 y.o.   MRN: [id number]  Chief Complaint  Patient presents with   Medical Management of Chronic Issues    HPI Patient is in today for follow up of HTN. He started on losartan 2 weeks ago Complaint with this and chlorthalidone. He denies side effects. He has not been checking blood pressure at home. Denies symptoms.    ROS As per HPI.      Objective:    BP (!) 142/60   Pulse 65   Temp 98.2 F (36.8 C) (Temporal)   Ht 5\' 11"  (1.803 m)   Wt 197 lb 12.8 oz (89.7 kg)   SpO2 99%   BMI 27.59 kg/m  BP Readings from Last 3 Encounters:  08/27/23 (!) 142/60  08/05/23 (!) 176/76  07/18/23 (!) 144/66      Physical Exam Vitals and nursing note reviewed.  Constitutional:      General: He is not in acute distress.    Appearance: Normal appearance. He is not ill-appearing, toxic-appearing or diaphoretic.  Cardiovascular:     Rate and Rhythm: Normal rate and regular rhythm.     Heart sounds: Normal heart sounds. No murmur heard. Pulmonary:     Effort: Pulmonary effort is normal. No respiratory distress.     Breath sounds: Normal breath sounds. No stridor. No wheezing, rhonchi or rales.  Musculoskeletal:     Right lower leg: No edema.     Left lower leg: No edema.  Skin:    General: Skin is warm and dry.  Neurological:     General: No focal deficit present.     Mental Status: He is alert and oriented to person, place, and time.  Psychiatric:        Mood and Affect: Mood normal.        Behavior: Behavior normal.     No results found for any visits on 08/27/23.      Assessment & Plan:   Isaac Beltran was seen today for medical management of chronic issues.  Diagnoses and all orders for this visit:  Primary hypertension -     BMP8+EGFR  Stage 3b chronic kidney disease (HCC) -     BMP8+EGFR   Systolic is mildly elevated. Did not make any changes to regimen however as his  diastolic is 60 today. Will recheck BMP after starting ARB.    Return in about 3 months (around 11/24/2023) for chronic follow up.  The patient indicates understanding of these issues and agrees with the plan.  Gabriel Earing, FNP

## 2023-08-28 LAB — BMP8+EGFR
BUN/Creatinine Ratio: 21 (ref 10–24)
BUN: 43 mg/dL — ABNORMAL HIGH (ref 8–27)
CO2: 20 mmol/L (ref 20–29)
Calcium: 9.7 mg/dL (ref 8.6–10.2)
Chloride: 109 mmol/L — ABNORMAL HIGH (ref 96–106)
Creatinine, Ser: 2.04 mg/dL — ABNORMAL HIGH (ref 0.76–1.27)
Glucose: 153 mg/dL — ABNORMAL HIGH (ref 70–99)
Potassium: 4.7 mmol/L (ref 3.5–5.2)
Sodium: 145 mmol/L — ABNORMAL HIGH (ref 134–144)
eGFR: 32 mL/min/{1.73_m2} — ABNORMAL LOW (ref >59)

## 2023-11-24 ENCOUNTER — Ambulatory Visit: Payer: Medicare Other | Admitting: Family Medicine

## 2024-01-15 ENCOUNTER — Ambulatory Visit: Payer: Medicare Other | Admitting: Family Medicine

## 2024-01-21 ENCOUNTER — Encounter: Payer: Self-pay | Admitting: Family Medicine

## 2024-01-23 ENCOUNTER — Other Ambulatory Visit: Payer: Self-pay | Admitting: Family Medicine

## 2024-01-23 DIAGNOSIS — D631 Anemia in chronic kidney disease: Secondary | ICD-10-CM

## 2024-02-17 ENCOUNTER — Other Ambulatory Visit: Payer: Self-pay | Admitting: Family Medicine

## 2024-02-17 DIAGNOSIS — D631 Anemia in chronic kidney disease: Secondary | ICD-10-CM

## 2024-02-17 NOTE — Telephone Encounter (Signed)
 Left message to schedule appt to get meds refilled

## 2024-02-17 NOTE — Telephone Encounter (Signed)
 Tiffany pt NTBS 30-d given 01/23/24

## 2024-02-21 ENCOUNTER — Other Ambulatory Visit: Payer: Self-pay | Admitting: Family Medicine

## 2024-02-21 DIAGNOSIS — D631 Anemia in chronic kidney disease: Secondary | ICD-10-CM

## 2024-02-23 ENCOUNTER — Encounter: Payer: Self-pay | Admitting: Family Medicine

## 2024-02-23 NOTE — Telephone Encounter (Signed)
 Tiffany pt NTBS 30-d given 01/23/24

## 2024-02-23 NOTE — Telephone Encounter (Signed)
 Letter sent

## 2024-03-02 ENCOUNTER — Ambulatory Visit: Admitting: Family Medicine

## 2024-03-03 ENCOUNTER — Ambulatory Visit (INDEPENDENT_AMBULATORY_CARE_PROVIDER_SITE_OTHER): Admitting: Family Medicine

## 2024-03-03 ENCOUNTER — Encounter: Payer: Self-pay | Admitting: Family Medicine

## 2024-03-03 VITALS — BP 137/57 | HR 67 | Temp 98.5°F | Ht 71.0 in | Wt 202.8 lb

## 2024-03-03 DIAGNOSIS — E782 Mixed hyperlipidemia: Secondary | ICD-10-CM

## 2024-03-03 DIAGNOSIS — R6889 Other general symptoms and signs: Secondary | ICD-10-CM | POA: Diagnosis not present

## 2024-03-03 DIAGNOSIS — D631 Anemia in chronic kidney disease: Secondary | ICD-10-CM

## 2024-03-03 DIAGNOSIS — I10 Essential (primary) hypertension: Secondary | ICD-10-CM

## 2024-03-03 DIAGNOSIS — K219 Gastro-esophageal reflux disease without esophagitis: Secondary | ICD-10-CM

## 2024-03-03 DIAGNOSIS — N1832 Chronic kidney disease, stage 3b: Secondary | ICD-10-CM | POA: Diagnosis not present

## 2024-03-03 LAB — LIPID PANEL

## 2024-03-03 NOTE — Progress Notes (Signed)
 Established Patient Office Visit  Subjective   Patient ID: Isaac Beltran, male    DOB: 03-08-1941  Age: 83 y.o. MRN: [id number]  Chief Complaint  Patient presents with   Medical Management of Chronic Issues    HPI HTN Complaint with meds - Yes Current Medications - chlorthalidone  25 mg. Losartan  25 mg Checking BP at home - no Pertinent ROS:  Headache - No Fatigue - No Visual Disturbances - No Chest pain - No Dyspnea - No Palpitations - No LE edema - No  2. HLD Last LDL was 103. Not currently on medications. He is not fasting today.  3. GERD Compliant with medications - Yes Current medications - prilosec 20 mg Sore throat - No Voice change - No Hemoptysis - No Dysphagia or dyspepsia - No Water brash - No Red Flags (weight loss, hematochezia, melena, weight loss, early satiety, fevers, odynophagia, or persistent vomiting) - No  Hx of esophogeal strictures.   4. IDA Has been taking iron supplement daily until he ran out a few days ago. He feels better since he hasn't taken it over the last few days. Repots diarrhea and fatigue with iron supplements.    Past Medical History:  Diagnosis Date   Anemia    CKD (chronic kidney disease)    GERD (gastroesophageal reflux disease)    Hyperlipidemia    Hyperparathyroidism (HCC)    Hypertension    Lumbar spinal stenosis    Psoriasis    Vitamin D  deficiency       ROS As per HPI.    Objective:     BP (!) 137/57   Pulse 67   Temp 98.5 F (36.9 C) (Temporal)   Ht 5' 11 (1.803 m)   Wt 202 lb 12.8 oz (92 kg)   SpO2 97%   BMI 28.28 kg/m  BP Readings from Last 3 Encounters:  03/03/24 (!) 137/57  08/27/23 (!) 142/60  08/05/23 (!) 176/76   Wt Readings from Last 3 Encounters:  03/03/24 202 lb 12.8 oz (92 kg)  08/27/23 197 lb 12.8 oz (89.7 kg)  07/18/23 202 lb (91.6 kg)     Physical Exam Vitals and nursing note reviewed.  Constitutional:      General: He is not in acute distress.    Appearance: Normal  appearance. He is not ill-appearing, toxic-appearing or diaphoretic.  Eyes:     General: No scleral icterus. Cardiovascular:     Rate and Rhythm: Normal rate and regular rhythm.     Heart sounds: Normal heart sounds. No murmur heard. Pulmonary:     Effort: Pulmonary effort is normal. No respiratory distress.     Breath sounds: Normal breath sounds.  Musculoskeletal:     Cervical back: No rigidity.     Right lower leg: No edema.     Left lower leg: No edema.  Skin:    General: Skin is warm and dry.  Neurological:     General: No focal deficit present.     Mental Status: He is alert and oriented to person, place, and time.     Cranial Nerves: No cranial nerve deficit.     Motor: No weakness.     Coordination: Coordination normal.     Gait: Gait normal.  Psychiatric:        Mood and Affect: Mood normal.        Behavior: Behavior normal.    No results found for any visits on 03/03/24.   The ASCVD Risk score (Arnett DK,  et al., 2019) failed to calculate for the following reasons:   The 2019 ASCVD risk score is only valid for ages 60 to 52    Assessment & Plan:   Caleb was seen today for medical management of chronic issues.  Diagnoses and all orders for this visit:  Primary hypertension Well controlled on current regimen.  -     CBC with Differential/Platelet -     CMP14+EGFR  Mixed hyperlipidemia Nonfasting panel pending. Not on statin.  -     Lipid panel  Gastroesophageal reflux disease, unspecified whether esophagitis present Well controlled on current regimen.   Stage 3b chronic kidney disease (HCC) CMP and CBC pending. Avoid NSAIDs.   Anemia due to stage 3b chronic kidney disease (HCC) Labs pending.  -     CBC with Differential/Platelet -     Iron, TIBC and Ferritin Panel  Return in about 6 months (around 09/03/2024) for CPE.   The patient indicates understanding of these issues and agrees with the plan.  Annabella CHRISTELLA Search, FNP

## 2024-03-04 ENCOUNTER — Other Ambulatory Visit: Payer: Self-pay | Admitting: Family Medicine

## 2024-03-04 ENCOUNTER — Ambulatory Visit: Payer: Self-pay | Admitting: Family Medicine

## 2024-03-04 DIAGNOSIS — D631 Anemia in chronic kidney disease: Secondary | ICD-10-CM

## 2024-03-04 LAB — CMP14+EGFR
ALT: 11 IU/L (ref 0–44)
AST: 18 IU/L (ref 0–40)
Albumin: 4.2 g/dL (ref 3.7–4.7)
Alkaline Phosphatase: 56 IU/L (ref 44–121)
BUN/Creatinine Ratio: 16 (ref 10–24)
BUN: 30 mg/dL — AB (ref 8–27)
Bilirubin Total: 0.6 mg/dL (ref 0.0–1.2)
CO2: 20 mmol/L (ref 20–29)
Calcium: 9.4 mg/dL (ref 8.6–10.2)
Chloride: 107 mmol/L — AB (ref 96–106)
Creatinine, Ser: 1.92 mg/dL — AB (ref 0.76–1.27)
Globulin, Total: 2.3 g/dL (ref 1.5–4.5)
Glucose: 88 mg/dL (ref 70–99)
Potassium: 4.9 mmol/L (ref 3.5–5.2)
Sodium: 140 mmol/L (ref 134–144)
Total Protein: 6.5 g/dL (ref 6.0–8.5)
eGFR: 34 mL/min/1.73 — AB (ref >59)

## 2024-03-04 LAB — CBC WITH DIFFERENTIAL/PLATELET
Basophils Absolute: 0.1 x10E3/uL (ref 0.0–0.2)
Basos: 1 %
EOS (ABSOLUTE): 0.5 x10E3/uL — ABNORMAL HIGH (ref 0.0–0.4)
Eos: 8 %
Hematocrit: 36.5 % — ABNORMAL LOW (ref 37.5–51.0)
Hemoglobin: 11.5 g/dL — ABNORMAL LOW (ref 13.0–17.7)
Immature Grans (Abs): 0 x10E3/uL (ref 0.0–0.1)
Immature Granulocytes: 0 %
Lymphocytes Absolute: 2.3 x10E3/uL (ref 0.7–3.1)
Lymphs: 34 %
MCH: 30.7 pg (ref 26.6–33.0)
MCHC: 31.5 g/dL (ref 31.5–35.7)
MCV: 97 fL (ref 79–97)
Monocytes Absolute: 0.7 x10E3/uL (ref 0.1–0.9)
Monocytes: 10 %
Neutrophils Absolute: 3.1 x10E3/uL (ref 1.4–7.0)
Neutrophils: 47 %
Platelets: 299 x10E3/uL (ref 150–450)
RBC: 3.75 x10E6/uL — ABNORMAL LOW (ref 4.14–5.80)
RDW: 12.6 % (ref 11.6–15.4)
WBC: 6.6 x10E3/uL (ref 3.4–10.8)

## 2024-03-04 LAB — LIPID PANEL
Cholesterol, Total: 143 mg/dL (ref 100–199)
HDL: 56 mg/dL (ref >39)
LDL CALC COMMENT:: 2.6 ratio (ref 0.0–5.0)
LDL Chol Calc (NIH): 72 mg/dL (ref 0–99)
Triglycerides: 78 mg/dL (ref 0–149)
VLDL Cholesterol Cal: 15 mg/dL (ref 5–40)

## 2024-03-04 LAB — IRON,TIBC AND FERRITIN PANEL
Ferritin: 51 ng/mL (ref 30–400)
Iron Saturation: 19 (ref 15–55)
Iron: 61 ug/dL (ref 38–169)
Total Iron Binding Capacity: 327 ug/dL (ref 250–450)
UIBC: 266 ug/dL (ref 111–343)

## 2024-03-04 MED ORDER — FERROUS SULFATE 325 (65 FE) MG PO TBEC: 1.0000 | DELAYED_RELEASE_TABLET | Freq: Every day | ORAL | 3 refills | Status: AC

## 2024-03-04 NOTE — Telephone Encounter (Signed)
 Copied from CRM #[id number]. Topic: Clinical - Lab/Test Results >> Mar 04, 2024 12:22 PM Wess RAMAN wrote: Reason for CRM: Patient would like his lab results read to him  Callback #:[id number]

## 2024-03-04 NOTE — Telephone Encounter (Signed)
 Went over labs with patient. He states he needs a refill of ferrous sulfate .

## 2024-03-25 ENCOUNTER — Ambulatory Visit

## 2024-03-25 VITALS — BP 137/57 | HR 67 | Ht 71.0 in | Wt 202.0 lb

## 2024-03-25 DIAGNOSIS — Z0000 Encounter for general adult medical examination without abnormal findings: Secondary | ICD-10-CM

## 2024-03-25 NOTE — Progress Notes (Signed)
 Subjective:   Isaac Beltran is a 83 y.o. who presents for a Medicare Wellness preventive visit.  As a reminder, Annual Wellness Visits don't include a physical exam, and some assessments may be limited, especially if this visit is performed virtually. We may recommend an in-person follow-up visit with your provider if needed.  Visit Complete: Virtual I connected with  Isaac Beltran on 03/25/24 by a audio enabled telemedicine application and verified that I am speaking with the correct person using two identifiers.  Patient Location: Home  Provider Location: Home Office  I discussed the limitations of evaluation and management by telemedicine. The patient expressed understanding and agreed to proceed.  Vital Signs: Because this visit was a virtual/telehealth visit, some criteria may be missing or patient reported. Any vitals not documented were not able to be obtained and vitals that have been documented are patient reported.  VideoDeclined- This patient declined Librarian, academic. Therefore the visit was completed with audio only.  Persons Participating in Visit: Patient.  AWV Questionnaire: No: Patient Medicare AWV questionnaire was not completed prior to this visit.  Cardiac Risk Factors include: advanced age (>61men, >38 women);dyslipidemia;hypertension;smoking/ tobacco exposure     Objective:    Today's Vitals   03/25/24 1230  BP: (!) 137/57  Pulse: 67  Weight: 202 lb (91.6 kg)  Height: 5' 11 (1.803 m)   Body mass index is 28.17 kg/m.     03/25/2024   12:34 PM 03/26/2023    4:43 PM 02/05/2023    3:24 PM 02/01/2022   10:43 AM 01/31/2021   11:29 AM  Advanced Directives  Does Patient Have a Medical Advance Directive? No Yes No Yes Yes  Type of Aeronautical engineer of [place];Living will Healthcare Power of [place];Living will  Copy of Healthcare Power of Attorney in Chart?    No - copy requested No - copy requested   Would patient like information on creating a medical advance directive?   Yes (MAU/Ambulatory/Procedural Areas - Information given)      Current Medications (verified) Outpatient Encounter Medications as of 03/25/2024  Medication Sig   acetaminophen (TYLENOL) 500 MG tablet Take 500 mg by mouth every 6 (six) hours as needed.   betamethasone  dipropionate 0.05 % cream Apply topically 2 (two) times daily.   chlorthalidone  (HYGROTON ) 25 MG tablet Take 1 tablet (25 mg total) by mouth daily.   Cholecalciferol (VITAMIN D3) 25 MCG (1000 UT) CAPS Take by mouth.   diphenhydramine-acetaminophen (TYLENOL PM) 25-500 MG TABS tablet Take 1 tablet by mouth at bedtime as needed.   losartan  (COZAAR ) 25 MG tablet Take 1 tablet (25 mg total) by mouth daily.   omeprazole  (PRILOSEC) 20 MG capsule Take 1 capsule (20 mg total) by mouth daily.   sildenafil  (REVATIO ) 20 MG tablet TAKE 1 TO 5 TABLETS BY MOUTH ONCE DAILY AS NEEDED   ferrous sulfate  325 (65 FE) MG EC tablet Take 1 tablet (325 mg total) by mouth daily. (Patient not taking: Reported on 03/25/2024)   No facility-administered encounter medications on file as of 03/25/2024.    Allergies (verified) Simvastatin   History: Past Medical History:  Diagnosis Date   Anemia    CKD (chronic kidney disease)    GERD (gastroesophageal reflux disease)    Hyperlipidemia    Hyperparathyroidism (HCC)    Hypertension    Lumbar spinal stenosis    Psoriasis    Vitamin D  deficiency    History reviewed. No pertinent surgical  history. Family History  Problem Relation Age of Onset   Alzheimer's disease Mother    Heart attack Father    Social History   Socioeconomic History   Marital status: Widowed    Spouse name: Not on file   Number of children: Not on file   Years of education: Not on file   Highest education level: Not on file  Occupational History   Occupation: retired  Tobacco Use   Smoking status: Former    Current packs/day: 0.00    Types:  Cigarettes    Quit date: 04/13/1985    Years since quitting: 38.9   Smokeless tobacco: Never  Vaping Use   Vaping status: Never Used  Substance and Sexual Activity   Alcohol use: Yes    Alcohol/week: 1.0 standard drink of alcohol    Types: 1 Cans of beer per week    Comment: occ   Drug use: Never   Sexual activity: Not on file  Other Topics Concern   Not on file  Social History Narrative   He cares for an adopted child who is now 83 - 02/01/22   Social Drivers of Health   Financial Resource Strain: Low Risk  (03/25/2024)   Overall Financial Resource Strain (CARDIA)    Difficulty of Paying Living Expenses: Not hard at all  Food Insecurity: No Food Insecurity (03/25/2024)   Hunger Vital Sign    Worried About Running Out of Food in the Last Year: Never true    Ran Out of Food in the Last Year: Never true  Transportation Needs: No Transportation Needs (03/25/2024)   PRAPARE - Administrator, Civil Service (Medical): No    Lack of Transportation (Non-Medical): No  Physical Activity: Insufficiently Active (03/25/2024)   Exercise Vital Sign    Days of Exercise per Week: 3 days    Minutes of Exercise per Session: 30 min  Stress: No Stress Concern Present (03/25/2024)   [employer] of Occupational Health - Occupational Stress Questionnaire    Feeling of Stress: Not at all  Social Connections: Socially Isolated (03/25/2024)   Social Connection and Isolation Panel    Frequency of Communication with Friends and Family: Twice a week    Frequency of Social Gatherings with Friends and Family: Twice a week    Attends Religious Services: Never    Database administrator or Organizations: No    Attends Banker Meetings: Never    Marital Status: Widowed    Tobacco Counseling Counseling given: Yes    Clinical Intake:  Pre-visit preparation completed: Yes  Pain : No/denies pain     BMI - recorded: 28.17 Nutritional Status: BMI 25 -29  Overweight Nutritional Risks: None Diabetes: No  No results found for: HGBA1C   How often do you need to have someone help you when you read instructions, pamphlets, or other written materials from your doctor or pharmacy?: 1 - Never  Interpreter Needed?: No  Information entered by :: alia t/cma   Activities of Daily Living     03/25/2024   12:33 PM  In your present state of health, do you have any difficulty performing the following activities:  Hearing? 0  Vision? 0  Difficulty concentrating or making decisions? 0  Walking or climbing stairs? 0  Dressing or bathing? 0  Doing errands, shopping? 0  Preparing Food and eating ? N  Using the Toilet? N  In the past six months, have you accidently leaked urine? Y  Do  you have problems with loss of bowel control? N  Managing your Medications? N  Managing your Finances? Y  Housekeeping or managing your Housekeeping? N    Patient Care Team: Joesph Annabella HERO, FNP as PCP - General (Family Medicine) Douglass Lunger, MD as Consulting Physician (Nephrology)  I have updated your Care Teams any recent Medical Services you may have received from other providers in the past year.     Assessment:   This is a routine wellness examination for Isaac Beltran.  Hearing/Vision screen Hearing Screening - Comments:: Pt have hearing dif Vision Screening - Comments:: Pt denies vision dif/pt currently don't ophthalmology/suggested to get eyes check   Goals Addressed             This Visit's Progress    Patient Stated   On track    Hopes to stay active and independent       Depression Screen     03/25/2024   12:38 PM 03/03/2024    8:22 AM 08/27/2023    2:34 PM 07/18/2023    3:06 PM 02/05/2023    3:23 PM 10/24/2022    1:22 PM 07/26/2022    3:09 PM  PHQ 2/9 Scores  PHQ - 2 Score 0 1 0 0 0 0 0  PHQ- 9 Score 0 4 2 2  1      Fall Risk     03/25/2024   12:32 PM 03/03/2024    8:21 AM 08/27/2023    2:34 PM 07/18/2023    3:05 PM 02/05/2023    3:22  PM  Fall Risk   Falls in the past year? 0 0 0 0 0  Number falls in past yr: 0    0  Injury with Fall? 0    0  Risk for fall due to : No Fall Risks    No Fall Risks  Follow up Falls evaluation completed    Falls prevention discussed    MEDICARE RISK AT HOME:  Medicare Risk at Home Any stairs in or around the home?: No If so, are there any without handrails?: No Home free of loose throw rugs in walkways, pet beds, electrical cords, etc?: Yes Adequate lighting in your home to reduce risk of falls?: Yes Life alert?: No Use of a cane, walker or w/c?: No Grab bars in the bathroom?: No Shower chair or bench in shower?: No Elevated toilet seat or a handicapped toilet?: No  TIMED UP AND GO:  Was the test performed?  no  Cognitive Function: 6CIT completed        02/05/2023    3:24 PM 02/01/2022   10:42 AM 01/31/2021   11:29 AM  6CIT Screen  What Year? 0 points 0 points 0 points  What month? 0 points 0 points 0 points  What time? 0 points 0 points 0 points  Count back from 20 0 points 0 points 0 points  Months in reverse 0 points 4 points 2 points  Repeat phrase 0 points 4 points 2 points  Total Score 0 points 8 points 4 points    Immunizations  There is no immunization history on file for this patient.  Screening Tests Health Maintenance  Topic Date Due   DTaP/Tdap/Td (1 - Tdap) 07/17/2024 (Originally 09/24/1959)   Influenza Vaccine  10/12/2024 (Originally 02/13/2024)   Pneumococcal Vaccine: 50+ Years (1 of 2 - PCV) 03/03/2025 (Originally 09/24/1959)   Zoster Vaccines- Shingrix (1 of 2) 06/03/2025 (Originally 09/24/1959)   Medicare Annual Wellness (AWV)  03/25/2025  HPV VACCINES  Aged Out   Meningococcal B Vaccine  Aged Out   COVID-19 Vaccine  Discontinued    Health Maintenance Items Addressed: See Nurse Notes at the end of this note  Additional Screening:  Vision Screening: Recommended annual ophthalmology exams for early detection of glaucoma and other disorders of  the eye. Is the patient up to date with their annual eye exam?  No  Who is the provider or what is the name of the office in which the patient attends annual eye exams? Pt currently don't have an ophthalmologist, will find one and make an apt for an updated vision exam  Dental Screening: Recommended annual dental exams for proper oral hygiene  Community Resource Referral / Chronic Care Management: CRR required this visit?  No   CCM required this visit?  No   Plan:    I have personally reviewed and noted the following in the patient's chart:   Medical and social history Use of alcohol, tobacco or illicit drugs  Current medications and supplements including opioid prescriptions. Patient is not currently taking opioid prescriptions. Functional ability and status Nutritional status Physical activity Advanced directives List of other physicians Hospitalizations, surgeries, and ER visits in previous 12 months Vitals Screenings to include cognitive, depression, and falls Referrals and appointments  In addition, I have reviewed and discussed with patient certain preventive protocols, quality metrics, and best practice recommendations. A written personalized care plan for preventive services as well as general preventive health recommendations were provided to patient.   Isaac Beltran, CMA   03/25/2024   After Visit Summary: (MyChart) Due to this being a telephonic visit, the after visit summary with patients personalized plan was offered to patient via MyChart   Notes: Nothing significant to report at this time.

## 2024-03-25 NOTE — Patient Instructions (Signed)
 Mr. Isaac Beltran,  Thank you for taking the time for your Medicare Wellness Visit. I appreciate your continued commitment to your health goals. Please review the care plan we discussed, and feel free to reach out if I can assist you further.  Medicare recommends these wellness visits once per year to help you and your care team stay ahead of potential health issues. These visits are designed to focus on prevention, allowing your provider to concentrate on managing your acute and chronic conditions during your regular appointments.  Please note that Annual Wellness Visits do not include a physical exam. Some assessments may be limited, especially if the visit was conducted virtually. If needed, we may recommend a separate in-person follow-up with your provider.  Ongoing Care Seeing your primary care provider every 3 to 6 months helps us  monitor your health and provide consistent, personalized care.   Referrals If a referral was made during today's visit and you haven't received any updates within two weeks, please contact the referred provider directly to check on the status.  Recommended Screenings:  Health Maintenance  Topic Date Due   Medicare Annual Wellness Visit  02/05/2024   DTaP/Tdap/Td vaccine (1 - Tdap) 07/17/2024*   Flu Shot  10/12/2024*   Pneumococcal Vaccine for age over 56 (1 of 2 - PCV) 03/03/2025*   Zoster (Shingles) Vaccine (1 of 2) 06/03/2025*   HPV Vaccine  Aged Out   Meningitis B Vaccine  Aged Out   COVID-19 Vaccine  Discontinued  *Topic was postponed. The date shown is not the original due date.       03/25/2024   12:34 PM  Advanced Directives  Does Patient Have a Medical Advance Directive? No   Advance Care Planning is important because it: Ensures you receive medical care that aligns with your values, goals, and preferences. Provides guidance to your family and loved ones, reducing the emotional burden of decision-making during critical moments.  Vision: Annual  vision screenings are recommended for early detection of glaucoma, cataracts, and diabetic retinopathy. These exams can also reveal signs of chronic conditions such as diabetes and high blood pressure.  Dental: Annual dental screenings help detect early signs of oral cancer, gum disease, and other conditions linked to overall health, including heart disease and diabetes.  Please see the attached documents for additional preventive care recommendations.

## 2024-04-28 DIAGNOSIS — N1832 Chronic kidney disease, stage 3b: Secondary | ICD-10-CM | POA: Diagnosis not present

## 2024-05-04 DIAGNOSIS — I129 Hypertensive chronic kidney disease with stage 1 through stage 4 chronic kidney disease, or unspecified chronic kidney disease: Secondary | ICD-10-CM | POA: Diagnosis not present

## 2024-05-04 DIAGNOSIS — K219 Gastro-esophageal reflux disease without esophagitis: Secondary | ICD-10-CM | POA: Diagnosis not present

## 2024-05-04 DIAGNOSIS — N1832 Chronic kidney disease, stage 3b: Secondary | ICD-10-CM | POA: Diagnosis not present

## 2024-07-13 ENCOUNTER — Other Ambulatory Visit: Payer: Self-pay | Admitting: Family Medicine

## 2024-07-13 DIAGNOSIS — I10 Essential (primary) hypertension: Secondary | ICD-10-CM

## 2024-07-18 ENCOUNTER — Other Ambulatory Visit: Payer: Self-pay | Admitting: Family Medicine

## 2024-07-18 DIAGNOSIS — K219 Gastro-esophageal reflux disease without esophagitis: Secondary | ICD-10-CM

## 2024-09-03 ENCOUNTER — Encounter: Payer: Self-pay | Admitting: Family Medicine

## 2025-03-29 ENCOUNTER — Ambulatory Visit: Payer: Self-pay
